# Patient Record
Sex: Female | Born: 1965 | Race: White | Hispanic: No | Marital: Married | Smoking: Never smoker
Health system: Southern US, Community
[De-identification: ages and names within clinical notes are randomized; demographics above are authoritative.]

## PROBLEM LIST (undated history)

## (undated) DIAGNOSIS — M1990 Unspecified osteoarthritis, unspecified site: Secondary | ICD-10-CM

## (undated) HISTORY — PX: SHOULDER SURGERY: SHX246

## (undated) HISTORY — PX: CYSTECTOMY: SUR359

---

## 2014-04-24 ENCOUNTER — Other Ambulatory Visit: Payer: Self-pay | Admitting: Physician Assistant

## 2014-04-24 ENCOUNTER — Encounter: Payer: Self-pay | Admitting: Physician Assistant

## 2014-04-24 ENCOUNTER — Ambulatory Visit (INDEPENDENT_AMBULATORY_CARE_PROVIDER_SITE_OTHER): Payer: BC Managed Care – PPO | Admitting: Physician Assistant

## 2014-04-24 VITALS — BP 114/74 | HR 89 | Ht 70.0 in | Wt 232.0 lb

## 2014-04-24 DIAGNOSIS — E669 Obesity, unspecified: Secondary | ICD-10-CM | POA: Diagnosis not present

## 2014-04-24 DIAGNOSIS — E66811 Obesity, class 1: Secondary | ICD-10-CM | POA: Insufficient documentation

## 2014-04-24 DIAGNOSIS — N898 Other specified noninflammatory disorders of vagina: Secondary | ICD-10-CM

## 2014-04-24 DIAGNOSIS — E6609 Other obesity due to excess calories: Secondary | ICD-10-CM | POA: Insufficient documentation

## 2014-04-24 DIAGNOSIS — Z6831 Body mass index (BMI) 31.0-31.9, adult: Secondary | ICD-10-CM

## 2014-04-24 DIAGNOSIS — Z1231 Encounter for screening mammogram for malignant neoplasm of breast: Secondary | ICD-10-CM

## 2014-04-24 DIAGNOSIS — R635 Abnormal weight gain: Secondary | ICD-10-CM | POA: Diagnosis not present

## 2014-04-24 LAB — WET PREP FOR TRICH, YEAST, CLUE
Clue Cells Wet Prep HPF POC: NONE SEEN
Trich, Wet Prep: NONE SEEN
Yeast Wet Prep HPF POC: NONE SEEN

## 2014-04-24 MED ORDER — PHENTERMINE HCL 37.5 MG PO TABS: 37.5000 mg | ORAL_TABLET | Freq: Every day | ORAL | Status: DC

## 2014-04-24 NOTE — Progress Notes (Signed)
   Subjective:    Patient ID: Renee Hopkins, female    DOB: 01/22/1966, 49 y.o.   MRN: [id number]  HPI  Patient is a 49 year old female who presents to the clinic to establish care.  Patient has no pertinent past medical history.  .. Family History  Problem Relation Age of Onset  . Diabetes Father   . Hypertension Father   . Hypertension Brother   . Stroke Maternal Grandmother    .[name]. History   Social History  . Marital Status: Married    Spouse Name: N/A  . Number of Children: N/A  . Years of Education: N/A   Occupational History  . Not on file.   Social History Main Topics  . Smoking status: Never Smoker   . Smokeless tobacco: Not on file  . Alcohol Use: No  . Drug Use: No  . Sexual Activity:    Partners: Male   Other Topics Concern  . Not on file   Social History Narrative  . No narrative on file   Patient would like to be evaluated for some vaginal discharge. She has always had heavy her discharged and normal. She just needs to make sure that it has not changed. She denies any odor. Sometimes she has a mild itchiness but not all the time. She has one sexual partner her husband.  She is very motivated to lose weight at this time. She would like help in this arena. She has not tried any medications before. She is not currently exercising.   Review of Systems  All other systems reviewed and are negative.      Objective:   Physical Exam  Constitutional: She is oriented to person, place, and time. She appears well-developed and well-nourished.  Obese  HENT:  Head: Normocephalic and atraumatic.  Cardiovascular: Normal rate, regular rhythm and normal heart sounds.   Pulmonary/Chest: Effort normal and breath sounds normal.  Neurological: She is alert and oriented to person, place, and time.  Skin: Skin is dry.  Psychiatric: She has a normal mood and affect. Her behavior is normal.          Assessment & Plan:  Obesity/abnormal weight gain-discuss  options for medication weight loss. Patient decided on phentermine. Side effects were discussed. Patient will follow-up in one month for final and to discuss weight loss. We discussed in detail exercise plan and diet plan. Encouraged patient to keep to a 1200-1500-calorie diet. Also encouraged patient to exercise at least 150 minutes a week.  Vaginal discharge-will get wet prep today. Will treat accordingly. Certainly if she has a history of vaginal discharge this could be normal leukorrhea. If she is still concerned we could STD test her. She declined any STD testing today.

## 2014-04-24 NOTE — Patient Instructions (Signed)
[employer]  [employer]

## 2014-04-27 ENCOUNTER — Ambulatory Visit (INDEPENDENT_AMBULATORY_CARE_PROVIDER_SITE_OTHER): Payer: BC Managed Care – PPO

## 2014-04-27 DIAGNOSIS — Z1231 Encounter for screening mammogram for malignant neoplasm of breast: Secondary | ICD-10-CM

## 2014-05-09 ENCOUNTER — Encounter: Payer: Self-pay | Admitting: Physician Assistant

## 2014-05-09 ENCOUNTER — Ambulatory Visit (INDEPENDENT_AMBULATORY_CARE_PROVIDER_SITE_OTHER): Payer: BC Managed Care – PPO | Admitting: Physician Assistant

## 2014-05-09 VITALS — BP 129/81 | HR 106 | Wt 226.0 lb

## 2014-05-09 DIAGNOSIS — R21 Rash and other nonspecific skin eruption: Secondary | ICD-10-CM

## 2014-05-09 MED ORDER — CEPHALEXIN 500 MG PO CAPS: 500.0000 mg | ORAL_CAPSULE | Freq: Two times a day (BID) | ORAL | Status: DC

## 2014-05-09 MED ORDER — VALACYCLOVIR HCL 1 G PO TABS: 1000.0000 mg | ORAL_TABLET | Freq: Two times a day (BID) | ORAL | Status: DC

## 2014-05-09 NOTE — Progress Notes (Signed)
   Subjective:    Patient ID: Renee Hopkins, female    DOB: 02/11/1966, 49 y.o.   MRN: [id number]  HPI  Patient is a 49 year old female who presents to the clinic with a rash on her right buttocks. She first noticed the rash last week. She tried to put some Lotrimin cream and did seem to help dry up. More timely blisters have appeared since. She describes the rash is mostly itchy but does burn some. No fever, chills. She has just started using the tanning bed. She denies any new detergents or lotions.   Review of Systems  All other systems reviewed and are negative.      Objective:   Physical Exam  Skin:             Assessment & Plan:  Rash- appears to me like herpes with some surround cellulitis. Will send viral and wound culture off today. Treated with keflex and valtrex for 10 days. Cool compresses. Call with any new lesions or symptoms. Follow up as needed.

## 2014-05-12 LAB — WOUND CULTURE
GRAM STAIN: NONE SEEN
GRAM STAIN: NONE SEEN
Gram Stain: NONE SEEN
ORGANISM ID, BACTERIA: NO GROWTH

## 2014-05-13 LAB — RFLXH. SIMPLEX/VZ VIRUS CULT/DIF

## 2014-05-14 ENCOUNTER — Encounter: Payer: Self-pay | Admitting: Physician Assistant

## 2014-05-14 DIAGNOSIS — B009 Herpesviral infection, unspecified: Secondary | ICD-10-CM | POA: Insufficient documentation

## 2014-05-16 LAB — VIRAL CULTURE VIRC: SOURCE: 0

## 2014-05-22 ENCOUNTER — Encounter: Payer: Self-pay | Admitting: Physician Assistant

## 2014-05-22 ENCOUNTER — Ambulatory Visit (INDEPENDENT_AMBULATORY_CARE_PROVIDER_SITE_OTHER): Payer: BC Managed Care – PPO | Admitting: Physician Assistant

## 2014-05-22 VITALS — BP 133/84 | HR 96 | Ht 70.0 in | Wt 223.0 lb

## 2014-05-22 DIAGNOSIS — R635 Abnormal weight gain: Secondary | ICD-10-CM | POA: Diagnosis not present

## 2014-05-22 DIAGNOSIS — E669 Obesity, unspecified: Secondary | ICD-10-CM

## 2014-05-22 MED ORDER — PHENTERMINE HCL 37.5 MG PO TABS: 37.5000 mg | ORAL_TABLET | Freq: Every day | ORAL | Status: DC

## 2014-05-22 NOTE — Progress Notes (Signed)
   Subjective:    Patient ID: Renee Hopkins, female    DOB: 07/13/1965, 49 y.o.   MRN: [id number]  HPI Pt presents to the clinic for 1 month follow up on weight and phentermine. Taking phentermine daily. Down 9lbs in one month. She has no ongoing symptoms. She did have on episode where she forgot to eat and/or drink until about 3pm one day and passed out. She denies any heart palpitations, insomnia, anxiety, constipation. She is happy with results. She is walking a few times a week. She has decreased her calories substantially.     Review of Systems  All other systems reviewed and are negative.      Objective:   Physical Exam  Constitutional: She is oriented to person, place, and time. She appears well-developed and well-nourished.  HENT:  Head: Normocephalic and atraumatic.  Cardiovascular: Normal rate, regular rhythm and normal heart sounds.   Pulmonary/Chest: Effort normal and breath sounds normal.  Neurological: She is alert and oriented to person, place, and time.  Psychiatric: She has a normal mood and affect. Her behavior is normal.          Assessment & Plan:  Obesity/abnormal weight gain- refilled phentermine for one month. Follow up in 1 month with nurse visit. As long losing weight and no side effects will keep on for 6-9 months. I did discuss importance of eating at least 1200 calories a day and drinking plenty of water 40-60oz a day. Continue to increase exercise.

## 2014-05-26 ENCOUNTER — Telehealth: Payer: Self-pay | Admitting: Physician Assistant

## 2014-05-26 NOTE — Telephone Encounter (Signed)
i would do warm vinegar bath soaks 1-2 cups for 15 minutes at a time to restore normal PH. If having any discharge considerOTC montistat for yeast.

## 2014-05-26 NOTE — Telephone Encounter (Signed)
Informed Pt of recommendations. Verbalized understanding. No further questions.

## 2014-05-26 NOTE — Telephone Encounter (Signed)
Patient contacted clinic stating she has been having some vaginal itching and wants to know what you would recommend OTC to help her until she comes in for an appt on 05/29/14.

## 2014-05-29 ENCOUNTER — Other Ambulatory Visit: Payer: Self-pay | Admitting: *Deleted

## 2014-05-29 ENCOUNTER — Encounter: Payer: Self-pay | Admitting: Physician Assistant

## 2014-05-29 ENCOUNTER — Other Ambulatory Visit: Payer: Self-pay | Admitting: Physician Assistant

## 2014-05-29 ENCOUNTER — Ambulatory Visit (INDEPENDENT_AMBULATORY_CARE_PROVIDER_SITE_OTHER): Payer: BC Managed Care – PPO | Admitting: Physician Assistant

## 2014-05-29 VITALS — BP 128/83 | HR 89 | Wt 221.0 lb

## 2014-05-29 DIAGNOSIS — L304 Erythema intertrigo: Secondary | ICD-10-CM

## 2014-05-29 DIAGNOSIS — N898 Other specified noninflammatory disorders of vagina: Secondary | ICD-10-CM

## 2014-05-29 DIAGNOSIS — L298 Other pruritus: Secondary | ICD-10-CM | POA: Diagnosis not present

## 2014-05-29 DIAGNOSIS — N926 Irregular menstruation, unspecified: Secondary | ICD-10-CM | POA: Diagnosis not present

## 2014-05-29 LAB — WET PREP FOR TRICH, YEAST, CLUE
TRICH WET PREP: NONE SEEN
YEAST WET PREP: NONE SEEN

## 2014-05-29 MED ORDER — FLUCONAZOLE 150 MG PO TABS: 150.0000 mg | ORAL_TABLET | Freq: Once | ORAL | Status: DC

## 2014-05-29 MED ORDER — METRONIDAZOLE 500 MG PO TABS: 500.0000 mg | ORAL_TABLET | Freq: Two times a day (BID) | ORAL | Status: DC

## 2014-05-29 MED ORDER — NYSTATIN-TRIAMCINOLONE 100000-0.1 UNIT/GM-% EX OINT: 1.0000 "application " | TOPICAL_OINTMENT | Freq: Two times a day (BID) | CUTANEOUS | Status: DC

## 2014-05-29 NOTE — Progress Notes (Signed)
   Subjective:    Patient ID: Renee Hopkins, female    DOB: 05/04/1965, 49 y.o.   MRN: [id number]  HPI  Pt presents to the clinic with vaginal itching mostly on outside but some internally as well. No abnormal discharge. She has had some spotting as well in between her regular period. She has just started phentermine. She tans regularly. No fever, chills, n/v/d. No urinary symptoms.    Review of Systems  All other systems reviewed and are negative.      Objective:   Physical Exam  Constitutional: She is oriented to person, place, and time. She appears well-developed and well-nourished.  HENT:  Head: Normocephalic and atraumatic.  Cardiovascular: Normal rate, regular rhythm and normal heart sounds.   Pulmonary/Chest: Effort normal and breath sounds normal.  Genitourinary:     Neurological: She is alert and oriented to person, place, and time.  Skin: Skin is dry.  Psychiatric: She has a normal mood and affect. Her behavior is normal.          Assessment & Plan:  Intertrigo- diflucan given with nystatin cream. Likely tanning bed causing yeast. HO given. Suggested to use gold bond. She does admit to sweating a lot.   Vaginal itching- wet prep done. Will call with results.   Spotting- will check FSH/LH. Could be start of phentermine and increase in exercise. If continues can discuss options at future appt. Certainly could be some hormonal changes with possible menopausal changes occuring.

## 2014-05-29 NOTE — Patient Instructions (Signed)
Intertrigo Intertrigo is a skin condition that occurs in between folds of skin in places on the body that rub together a lot and do not get much ventilation. It is caused by heat, moisture, friction, sweat retention, and lack of air circulation, which produces red, irritated patches and, sometimes, scaling or drainage. People who have diabetes, who are obese, or who have treatment with antibiotics are at increased risk for intertrigo. The most common sites for intertrigo to occur include:  The groin.  The breasts.  The armpits.  Folds of abdominal skin.  Webbed spaces between the fingers or toes. Intertrigo may be aggravated by:  Sweat.  Feces.  Yeast or bacteria that are present near skin folds.  Urine.  Vaginal discharge. HOME CARE INSTRUCTIONS  The following steps can be taken to reduce friction and keep the affected area cool and dry:  Expose skin folds to the air.  Keep deep skin folds separated with cotton or linen cloth. Avoid tight fitting clothing that could cause chafing.  Wear open-toed shoes or sandals to help reduce moisture between the toes.  Apply absorbent powders to affected areas as directed by your caregiver.  Apply over-the-counter barrier pastes, such as zinc oxide, as directed by your caregiver.  If you develop a fungal infection in the affected area, your caregiver may have you use antifungal creams. SEEK MEDICAL CARE IF:   The rash is not improving after 1 week of treatment.  The rash is getting worse (more red, more swollen, more painful, or spreading).  You have a fever or chills. MAKE SURE YOU:   Understand these instructions.  Will watch your condition.  Will get help right away if you are not doing well or get worse. Document Released: 02/10/2005 Document Revised: 05/05/2011 Document Reviewed: 07/26/2009 Exit[employer] Patient Information 2015 [employer]. This information is not intended to replace advice given to you by your health  care provider. Make sure you discuss any questions you have with your health care provider.  

## 2014-06-01 DIAGNOSIS — L304 Erythema intertrigo: Secondary | ICD-10-CM | POA: Insufficient documentation

## 2014-06-20 ENCOUNTER — Encounter: Payer: Self-pay | Admitting: Physician Assistant

## 2014-06-20 ENCOUNTER — Ambulatory Visit (INDEPENDENT_AMBULATORY_CARE_PROVIDER_SITE_OTHER): Payer: BC Managed Care – PPO | Admitting: Physician Assistant

## 2014-06-20 ENCOUNTER — Other Ambulatory Visit: Payer: Self-pay | Admitting: *Deleted

## 2014-06-20 VITALS — BP 124/89 | HR 90 | Wt 211.0 lb

## 2014-06-20 DIAGNOSIS — E669 Obesity, unspecified: Secondary | ICD-10-CM

## 2014-06-20 DIAGNOSIS — R635 Abnormal weight gain: Secondary | ICD-10-CM

## 2014-06-20 MED ORDER — PHENTERMINE HCL 37.5 MG PO TABS: 37.5000 mg | ORAL_TABLET | Freq: Every day | ORAL | Status: DC

## 2014-06-20 NOTE — Progress Notes (Signed)
Patient ID: Renee Hopkins, female   DOB: 07/30/1965, 49 y.o.   MRN: [id number]     Patient came in today for a blood pressure & weight check.  Patient is doing well with weight loss program & blood pressure is good also.  One concern patient did have is that this month she is having a very heavy menstrual cycle.  Patient has heard about a vitamin called phen-vite  which helps with menstrual cycles. And  wonders what you think about it?  Abnormal weight gain/obesity- 10 pound weight loss. Doing great. Vitals stable. Phen-vites continue more appetite suppressing and energy formulations. I do not suggest to use with phentermine. Certainly once we stop would be ok to use. Phentermine refilled for 1 month. Follow up nurse visit in one month

## 2014-07-20 ENCOUNTER — Ambulatory Visit (INDEPENDENT_AMBULATORY_CARE_PROVIDER_SITE_OTHER): Payer: BC Managed Care – PPO | Admitting: Family Medicine

## 2014-07-20 VITALS — BP 120/79 | HR 91 | Wt 205.0 lb

## 2014-07-20 DIAGNOSIS — Z6829 Body mass index (BMI) 29.0-29.9, adult: Secondary | ICD-10-CM | POA: Diagnosis not present

## 2014-07-20 DIAGNOSIS — R635 Abnormal weight gain: Secondary | ICD-10-CM

## 2014-07-20 MED ORDER — PHENTERMINE HCL 37.5 MG PO TABS: 37.5000 mg | ORAL_TABLET | Freq: Every day | ORAL | Status: DC

## 2014-07-20 NOTE — Progress Notes (Signed)
   Subjective:    Patient ID: Renee PicklesMelanie Hopkins, female    DOB: 10/13/1965, 49 y.o.   MRN: [id number]  HPI Abnormal weight gain-down 6 pounds. Phentermine refill. Blood pressure at goal. Follow-up 4 weeks. Renee Gasseratherine Metheney, MD    Review of Systems     Objective:   Physical Exam        Assessment & Plan:

## 2014-07-20 NOTE — Progress Notes (Signed)
   Subjective:    Patient ID: Renee Hopkins, female    DOB: 07/19/1965, 49 y.o.   MRN: [id number]  HPI  Patient is here for blood pressure and weight check. Denies any trouble sleeping, palpitations, or any other medication problems. Patient states she has "ate more than she should" over the past month and has not been "walking like she should."   Review of Systems     Objective:   Physical Exam        Assessment & Plan:  Patient has lost weight. A refill for Phentermine will be sent to patient preferred pharmacy. Patient advised to schedule a four week nurse visit and keep her upcoming appointment with her PCP. Verbalized understanding, no further questions.

## 2014-08-15 ENCOUNTER — Ambulatory Visit (INDEPENDENT_AMBULATORY_CARE_PROVIDER_SITE_OTHER): Payer: BC Managed Care – PPO | Admitting: Family Medicine

## 2014-08-15 VITALS — BP 114/78 | HR 91 | Ht 70.0 in | Wt 198.0 lb

## 2014-08-15 DIAGNOSIS — R635 Abnormal weight gain: Secondary | ICD-10-CM | POA: Diagnosis not present

## 2014-08-15 DIAGNOSIS — E669 Obesity, unspecified: Secondary | ICD-10-CM

## 2014-08-15 MED ORDER — PHENTERMINE HCL 37.5 MG PO TABS: 37.5000 mg | ORAL_TABLET | Freq: Every day | ORAL | Status: DC

## 2014-08-15 NOTE — Progress Notes (Signed)
Weight loss success, another month of phentermine provided

## 2014-08-15 NOTE — Progress Notes (Signed)
   Subjective:    Patient ID: Renee Hopkins, female    DOB: 1965-04-18, 49 y.o.   MRN: [id number] Pt in today for weight & BP check.  She is down 7lbs from last month.  Denies any side effects. Donne Anon, CMA HPI    Review of Systems     Objective:   Physical Exam        Assessment & Plan:

## 2014-09-18 ENCOUNTER — Ambulatory Visit (INDEPENDENT_AMBULATORY_CARE_PROVIDER_SITE_OTHER): Payer: BC Managed Care – PPO | Admitting: Family Medicine

## 2014-09-18 VITALS — BP 123/80 | HR 94 | Wt 192.0 lb

## 2014-09-18 DIAGNOSIS — Z6827 Body mass index (BMI) 27.0-27.9, adult: Secondary | ICD-10-CM | POA: Diagnosis not present

## 2014-09-18 DIAGNOSIS — R635 Abnormal weight gain: Secondary | ICD-10-CM | POA: Diagnosis not present

## 2014-09-18 MED ORDER — PHENTERMINE HCL 37.5 MG PO TABS: 37.5000 mg | ORAL_TABLET | Freq: Every day | ORAL | Status: DC

## 2014-09-18 NOTE — Progress Notes (Signed)
   Subjective:    Patient ID: Renee Hopkins, female    DOB: October 14, 1965, 49 y.o.   MRN: [id number]  HPI    Review of Systems     Objective:   Physical Exam        Assessment & Plan:  Abnormal weight gain-she has lost 6 pounds which is fantastic. Blood pressures well controlled. Continue current regimen follow-up in one month.  Nani Gasser, MD

## 2014-09-18 NOTE — Progress Notes (Signed)
   Subjective:    Patient ID: Renee Hopkins, female    DOB: Mar 13, 1965, 49 y.o.   MRN: [id number]  HPI Patient is here for blood pressure and weight check. Denies any trouble sleeping, palpitations, or any other medication problems. Pt reports this month she has fluctuated from around 190-195, states she is still walking and watching her diet.    Review of Systems     Objective:   Physical Exam        Assessment & Plan:  Patient has lost weight. A refill for Phentermine will be sent to patient preferred pharmacy. Patient advised to schedule a four week nurse visit and keep her upcoming appointment with her PCP. Verbalized understanding, no further questions.

## 2014-10-23 ENCOUNTER — Encounter: Payer: Self-pay | Admitting: Physician Assistant

## 2014-10-23 ENCOUNTER — Ambulatory Visit (INDEPENDENT_AMBULATORY_CARE_PROVIDER_SITE_OTHER): Payer: BC Managed Care – PPO | Admitting: Physician Assistant

## 2014-10-23 VITALS — BP 108/74 | HR 102 | Ht 70.0 in | Wt 185.0 lb

## 2014-10-23 DIAGNOSIS — E663 Overweight: Secondary | ICD-10-CM | POA: Diagnosis not present

## 2014-10-23 DIAGNOSIS — R635 Abnormal weight gain: Secondary | ICD-10-CM | POA: Diagnosis not present

## 2014-10-23 MED ORDER — PHENTERMINE HCL 37.5 MG PO TABS: 37.5000 mg | ORAL_TABLET | Freq: Every day | ORAL | Status: DC

## 2014-10-23 NOTE — Progress Notes (Signed)
   Subjective:    Patient ID: Renee Hopkins, female    DOB: Jun 17, 1965, 49 y.o.   MRN: [id number]  HPI  Pt presents to the clinic to for 6 month follow up on phentermine. Doing great. 185 today with BMI of 26.54. Down 7lbs in last month. Total weight loss of 47lbs. Walking every night for exercise. Considering joining cross fit. cosuming 1200-1500 calories a day. No side effects. No insomnia, dry mouth, CP, tachycardia.     Review of Systems  All other systems reviewed and are negative.      Objective:   Physical Exam  Constitutional: She is oriented to person, place, and time. She appears well-developed and well-nourished.  HENT:  Head: Normocephalic and atraumatic.  Cardiovascular: Regular rhythm and normal heart sounds.   Tachycardia at 102.   Pulmonary/Chest: Effort normal and breath sounds normal. She has no wheezes.  Neurological: She is alert and oriented to person, place, and time.  Psychiatric: She has a normal mood and affect. Her behavior is normal.          Assessment & Plan:  Abnormal weight gain/overweight- doing great. Will transition down to phentermine 1/2 tablet daily. Follow up in 2 months. Continue diet and exercise. Cross fit I think would be a good addition.   Needs CPE will schedule in 2 months call to get labs before visit. Pt needs fasting labs.   Pt declined flu shot.

## 2014-11-07 ENCOUNTER — Encounter: Payer: Self-pay | Admitting: Physician Assistant

## 2014-11-08 ENCOUNTER — Other Ambulatory Visit: Payer: Self-pay | Admitting: Physician Assistant

## 2014-11-08 MED ORDER — VALACYCLOVIR HCL 500 MG PO TABS: 500.0000 mg | ORAL_TABLET | Freq: Two times a day (BID) | ORAL | Status: DC

## 2015-01-24 ENCOUNTER — Other Ambulatory Visit: Payer: Self-pay | Admitting: Physician Assistant

## 2015-01-24 ENCOUNTER — Encounter: Payer: Self-pay | Admitting: Physician Assistant

## 2015-01-24 MED ORDER — VALACYCLOVIR HCL 500 MG PO TABS: 500.0000 mg | ORAL_TABLET | Freq: Two times a day (BID) | ORAL | Status: DC

## 2015-01-24 NOTE — Addendum Note (Signed)
Addended by: Pixie CasinoUNNINGHAM, Cyndee Giammarco C on: 01/24/2015 09:34 AM   Modules accepted: Orders

## 2015-04-13 ENCOUNTER — Ambulatory Visit (INDEPENDENT_AMBULATORY_CARE_PROVIDER_SITE_OTHER): Payer: BC Managed Care – PPO

## 2015-04-13 ENCOUNTER — Encounter: Payer: Self-pay | Admitting: Rehabilitative and Restorative Service Providers"

## 2015-04-13 ENCOUNTER — Ambulatory Visit (INDEPENDENT_AMBULATORY_CARE_PROVIDER_SITE_OTHER): Payer: BC Managed Care – PPO | Admitting: Physician Assistant

## 2015-04-13 ENCOUNTER — Ambulatory Visit (INDEPENDENT_AMBULATORY_CARE_PROVIDER_SITE_OTHER): Payer: BC Managed Care – PPO | Admitting: Rehabilitative and Restorative Service Providers"

## 2015-04-13 ENCOUNTER — Encounter: Payer: Self-pay | Admitting: Physician Assistant

## 2015-04-13 VITALS — BP 137/72 | HR 95 | Ht 70.0 in | Wt 196.0 lb

## 2015-04-13 DIAGNOSIS — M623 Immobility syndrome (paraplegic): Secondary | ICD-10-CM | POA: Diagnosis not present

## 2015-04-13 DIAGNOSIS — M62830 Muscle spasm of back: Secondary | ICD-10-CM | POA: Diagnosis not present

## 2015-04-13 DIAGNOSIS — R293 Abnormal posture: Secondary | ICD-10-CM | POA: Diagnosis not present

## 2015-04-13 DIAGNOSIS — Z7409 Other reduced mobility: Secondary | ICD-10-CM

## 2015-04-13 DIAGNOSIS — M2560 Stiffness of unspecified joint, not elsewhere classified: Secondary | ICD-10-CM

## 2015-04-13 DIAGNOSIS — M542 Cervicalgia: Secondary | ICD-10-CM

## 2015-04-13 DIAGNOSIS — S29012A Strain of muscle and tendon of back wall of thorax, initial encounter: Secondary | ICD-10-CM | POA: Diagnosis not present

## 2015-04-13 DIAGNOSIS — M25512 Pain in left shoulder: Secondary | ICD-10-CM | POA: Diagnosis not present

## 2015-04-13 MED ORDER — CYCLOBENZAPRINE HCL 10 MG PO TABS: 10.0000 mg | ORAL_TABLET | Freq: Three times a day (TID) | ORAL | Status: DC | PRN

## 2015-04-13 MED ORDER — KETOROLAC TROMETHAMINE 60 MG/2ML IM SOLN
60.0000 mg | Freq: Once | INTRAMUSCULAR | Status: AC
  Administered 2015-04-13: 60 mg via INTRAMUSCULAR

## 2015-04-13 MED ORDER — MELOXICAM 15 MG PO TABS: 15.0000 mg | ORAL_TABLET | Freq: Every day | ORAL | Status: DC

## 2015-04-13 MED ORDER — TRAMADOL HCL 50 MG PO TABS: 50.0000 mg | ORAL_TABLET | Freq: Three times a day (TID) | ORAL | Status: DC | PRN
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
## 2015-04-13 NOTE — Patient Instructions (Signed)
Self massage using ~4 inch rubber ball   Axial Extension (Chin Tuck)    Pull chin in and lengthen back of neck. Hold _10-15___ seconds while counting out loud. Repeat _5-10___ times. Do _several___ sessions per day.   Shoulder Blade Squeeze     Can use swim noodle to help with posture and as a tactile cue  Rotate shoulders back, then squeeze shoulder blades down and back Hold 10 sec  Repeat __10__ times. Do __several__ sessions per day.   ROM: Pendulum (Circular)    Let right arm move in circle clockwise, then counterclockwise, by rocking body weight in circular pattern. Circle __30__ times each direction per set. Do __1-2__ sets per session. Do __3-4__ sessions per day.  Abduction (Eccentric) - Active-Assist (Pulley)   First position is with hands facing each other about shoulder width apart; second position is with arms out to side slightly.  With hands lightly holding pulley, raise affected arm out to side with other arm. Avoid hiking shoulder. Then slowly lower affected arm for 10 seconds. __10_ reps per set for each position, _3-4__ sets per day   Anterior Capsule Stretch, Standing    Stand holding stick behind back, hands palms up. Lift stick away from body as far as possible. Hold __10_ seconds. Repeat _5-10__ times per session. Do _3-4__ sessions per day.     External Rotator Cuff Stretch, Standing    Stand holding club behind body, one arm above head, other arm bent behind back. With lower hand, pull gently downward. Hold _10__ seconds. Repeat _10__ times per session. Do _3-4__ sessions per day.    External Rotator Cuff Stretch, Sitting (Passive)    Sit with elbow bent, forearm on table, palm down. Bend forward at waist until a stretch is felt in shoulder. Hold _10__ seconds.  Repeat __10_ times per session. Do _3-4__ sessions per day. Can do this stretch in standing  Check info for adhesive capsulitis or frozen shoulder    TENS UNIT: This is  helpful for muscle pain and spasm.   Search and Purchase a TENS 7000 2nd edition at www.tenspros.com. It should be less than $30.     TENS unit instructions: Do not shower or bathe with the unit on Turn the unit off before removing electrodes or batteries If the electrodes lose stickiness add a drop of water to the electrodes after they are disconnected from the unit and place on plastic sheet. If you continued to have difficulty, call the TENS unit company to purchase more electrodes. Do not apply lotion on the skin area prior to use. Make sure the skin is clean and dry as this will help prolong the life of the electrodes. After use, always check skin for unusual red areas, rash or other skin difficulties. If there are any skin problems, does not apply electrodes to the same area. Never remove the electrodes from the unit by pulling the wires. Do not use the TENS unit or electrodes other than as directed. Do not change electrode placement without consultating your therapist or physician. Keep 2 fingers with between each electrode.
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
## 2015-04-13 NOTE — Patient Instructions (Signed)
Will place PT order for you start.

## 2015-04-16 ENCOUNTER — Encounter: Payer: BC Managed Care – PPO | Admitting: Physical Therapy

## 2015-04-16 ENCOUNTER — Encounter: Payer: Self-pay | Admitting: Physician Assistant

## 2015-04-16 DIAGNOSIS — S29012A Strain of muscle and tendon of back wall of thorax, initial encounter: Secondary | ICD-10-CM | POA: Insufficient documentation

## 2015-04-16 DIAGNOSIS — M5412 Radiculopathy, cervical region: Secondary | ICD-10-CM | POA: Insufficient documentation

## 2015-04-16 DIAGNOSIS — M62830 Muscle spasm of back: Secondary | ICD-10-CM | POA: Insufficient documentation

## 2015-04-19 ENCOUNTER — Encounter: Payer: Self-pay | Admitting: Rehabilitative and Restorative Service Providers"

## 2015-04-19 ENCOUNTER — Ambulatory Visit (INDEPENDENT_AMBULATORY_CARE_PROVIDER_SITE_OTHER): Payer: BC Managed Care – PPO | Admitting: Rehabilitative and Restorative Service Providers"

## 2015-04-19 DIAGNOSIS — Z7409 Other reduced mobility: Secondary | ICD-10-CM | POA: Diagnosis not present

## 2015-04-19 DIAGNOSIS — M25512 Pain in left shoulder: Secondary | ICD-10-CM | POA: Diagnosis not present

## 2015-04-19 DIAGNOSIS — M623 Immobility syndrome (paraplegic): Secondary | ICD-10-CM | POA: Diagnosis not present

## 2015-04-19 DIAGNOSIS — R293 Abnormal posture: Secondary | ICD-10-CM

## 2015-04-19 DIAGNOSIS — M2560 Stiffness of unspecified joint, not elsewhere classified: Secondary | ICD-10-CM

## 2015-04-19 NOTE — Therapy (Signed)
[facility] [place] [address] [place], [place], [address] Phone: [phone]   Fax:  [phone]  Physical Therapy Treatment  Patient Details  Name: Renee Hopkins MRN: [id number] Date of Birth: 09-23-1965 Referring Provider: Tandy Gaw PCA  Encounter Date: 04/19/2015      PT End of Session - 04/19/15 1552    Visit Number 2   Number of Visits 12   Date for PT Re-Evaluation 05/25/15   PT Start Time 1544   PT Stop Time 1637   PT Time Calculation (min) 53 min   Activity Tolerance Patient tolerated treatment well;Patient limited by pain      History reviewed. No pertinent past medical history.  Past Surgical History  Procedure Laterality Date  . Shoulder surgery      There were no vitals filed for this visit.  Visit Diagnosis:  Shoulder pain, left  Stiffness due to immobility  Abnormal posture  Decreased functional mobility and endurance      Subjective Assessment - 04/19/15 1543    Subjective Patient reports that her shoulder pain is about the same overall. She can't take the tramadol at work but takes it at night. She is taking mm relaxant 1x/day. She has ordered a TENS unit and is using it at home and cont to use the Biofreeze.    Currently in Pain? Yes   Pain Score 5    Pain Location Shoulder   Pain Orientation Left   Pain Descriptors / Indicators Aching;Tightness   Pain Type Chronic pain   Pain Onset More than a month ago   Pain Frequency Constant            [facility] PT Assessment - 04/19/15 0001    Assessment   Medical Diagnosis Lt shoulder dysfunction   Referring Provider Tandy Gaw PCA   Onset Date/Surgical Date 10/09/15   Hand Dominance Right   Next MD Visit no appt scheduled   Prior Therapy none for current complaints   AROM   Left Shoulder Extension 33 Degrees   Left Shoulder Flexion 131 Degrees   Left Shoulder ABduction 120 Degrees                     [facility] Adult PT  Treatment/Exercise - 04/19/15 0001    Shoulder Exercises: Supine   Other Supine Exercises axial extension 10 sec x 5    Shoulder Exercises: Standing   Other Standing Exercises scap squeeze 10 sec x 10 reps with noodle    Shoulder Exercises: Pulleys   Flexion --  10 sec x 10    Other Pulley Exercises scaption 10 sec hold x 10   Shoulder Exercises: ROM/Strengthening   UBE (Upper Arm Bike) L1 x 3 min alt fwd/back   Pendulum 30 CW/30 CCW with 2 lb wt   Shoulder Exercises: Stretch   Internal Rotation Stretch 5 reps  10 sec hold with cane and with strap   Table Stretch - Flexion 5 reps;10 seconds   Other Shoulder Stretches shd extension with cane 5 sec x 5 reps; bringing cane across body in extension for horizontal adduction behind back 2 reps 5 sec hold    Other Shoulder Stretches doorway stretch x 3 positions 30 sec hold x 2    Moist Heat Therapy   Number Minutes Moist Heat 15 Minutes   Moist Heat Location Shoulder  Lt shd and upper back    Electrical Stimulation   Electrical Stimulation Location Lt shoulder  Electrical Stimulation Action IFC   Electrical Stimulation Parameters to tolerance   Electrical Stimulation Goals Pain;Tone   Manual Therapy   Manual therapy comments pt supine    Joint Mobilization scap Lt   Soft tissue mobilization ant/lat/post cervical; upper trap; leveator; periscapular musculature; pecs   Myofascial Release pesc/chest   Passive ROM cervical flexion and flexion eith slight rotatioin 1 x each 20-30 sec hold                 PT Education - 04/19/15 1604    Education provided Yes   Education Details HPEP   Person(s) Educated Patient   Methods Explanation;Demonstration;Tactile cues;Verbal cues;Handout   Comprehension Verbalized understanding;Returned demonstration;Verbal cues required;Tactile cues required             PT Long Term Goals - 04/19/15 1734    PT LONG TERM GOAL #1   Title Decrease pain allowing pt to sleep and work with min to  no pain 05/25/15   Time 6   Period Weeks   Status On-going   PT LONG TERM GOAL #2   Title Increase Lt shd ROM to equal or greater than Rt shd ROM 05/25/15   Time 6   Period Weeks   Status On-going   PT LONG TERM GOAL #3   Title Patient reports return to normal functional activities including laundry, washing dishes; sitting/standing/walking > 30 min 05/25/15   Time 6   Period Weeks   Status On-going   PT LONG TERM GOAL #4   Title I in HEP 05/25/15   Time 6   Period Weeks   Status On-going   PT LONG TERM GOAL #5   Title Improve FOTO to </= 34% limitation 05/25/15   Time 6   Period Weeks   Status On-going               Plan - 04/19/15 1731    Clinical Impression Statement Continued pain in the Lt shoudler and neck area as well as muscular tightness and banding. Pt has propped phone with Lt shoulder for 16 years. She has tightness through the cervical area influencing the shoulder pain. Patient may benefit form TDN which was discussed with her today and she is willing to try the needling.  Good improvement in shd/pec tightness with manual work and passive stretch through the cervical spine today.    Pt will benefit from skilled therapeutic intervention in order to improve on the following deficits Postural dysfunction;Improper body mechanics;Decreased range of motion;Decreased mobility;Increased fascial restricitons;Pain;Decreased endurance;Decreased activity tolerance   Rehab Potential Good   PT Frequency 2x / week   PT Duration 8 weeks   PT Treatment/Interventions Patient/family education;ADLs/Self Care Home Management;Therapeutic exercise;Therapeutic activities;Manual techniques;Dry needling;Neuromuscular re-education;Cryotherapy;Electrical Stimulation;Iontophoresis 4mg /ml Dexamethasone;Moist Heat;Ultrasound   PT Next Visit Plan progress with postural correction and posterior shd girdle strengthening as tolerated; work on Lt shd ROM; manual therapy and joint mobs through shoudler  and cervical spine; TDN as indicated   PT Home Exercise Plan postural correction; Myofacial ball release work: HEP    Financial planner with Plan of Care Patient        Problem List Patient Active Problem List   Diagnosis Date Noted  . Neck pain 04/16/2015  . Muscle spasm of back 04/16/2015  . Muscle strain of left upper back 04/16/2015  . Overweight (BMI 25.0-29.9) 10/23/2014  . Intertrigo 06/01/2014  . Herpes simplex 05/14/2014  . Abnormal weight gain 04/24/2014    Misheel Gowans Rober Minion PT, MPH  04/19/2015, 5:38 PM  [facility] [address] [place], [place], [address] Phone: [phone]   Fax:  [phone]  Name: Renee Hopkins MRN: [id number] Date of Birth: 1965/06/08

## 2015-04-19 NOTE — Patient Instructions (Signed)
Scapula Adduction With Pectoralis Stretch: Low - Standing   Shoulders at 45 hands even with shoulders, keeping weight through legs, shift weight forward until you feel pull or stretch through the front of your chest. Hold _30__ seconds. Do _3__ times, _2-4__ times per day.   Scapula Adduction With Pectoralis Stretch: Mid-Range - Standing   Shoulders at 90 elbows even with shoulders, keeping weight through legs, shift weight forward until you feel pull or strength through the front of your chest. Hold __30_ seconds. Do _3__ times, __2-4_ times per day.   Scapula Adduction With Pectoralis Stretch: High - Standing   Shoulders at 120 hands up high on the doorway, keeping weight on feet, shift weight forward until you feel pull or stretch through the front of your chest. Hold _30__ seconds. Do _3__ times, _2-3__ times per day.  

## 2015-04-26 ENCOUNTER — Encounter: Payer: BC Managed Care – PPO | Admitting: Rehabilitative and Restorative Service Providers"

## 2015-04-30 ENCOUNTER — Ambulatory Visit (INDEPENDENT_AMBULATORY_CARE_PROVIDER_SITE_OTHER): Payer: BC Managed Care – PPO | Admitting: Physical Therapy

## 2015-04-30 ENCOUNTER — Encounter: Payer: Self-pay | Admitting: Physician Assistant

## 2015-04-30 DIAGNOSIS — M623 Immobility syndrome (paraplegic): Secondary | ICD-10-CM | POA: Diagnosis not present

## 2015-04-30 DIAGNOSIS — R293 Abnormal posture: Secondary | ICD-10-CM

## 2015-04-30 DIAGNOSIS — M2560 Stiffness of unspecified joint, not elsewhere classified: Secondary | ICD-10-CM

## 2015-04-30 DIAGNOSIS — Z7409 Other reduced mobility: Secondary | ICD-10-CM

## 2015-04-30 DIAGNOSIS — M25512 Pain in left shoulder: Secondary | ICD-10-CM | POA: Diagnosis not present

## 2015-04-30 NOTE — Therapy (Addendum)
[place] [facility] [place], [place], [address] Phone: [phone]   Fax:  [phone]  Physical Therapy Treatment  Patient Details  Name: Renee Hopkins MRN: [id number] Date of Birth: [birth year] Referring Provider: Iran Planas PCA  Encounter Date: 04/30/2015      PT End of Session - 04/30/15 1525    Visit Number 3   Number of Visits 12   Date for PT Re-Evaluation 05/25/15   PT Start Time 1520   PT Stop Time 1616   PT Time Calculation (min) 56 min   Activity Tolerance Patient limited by pain      No past medical history on file.  Past Surgical History  Procedure Laterality Date  . Shoulder surgery      There were no vitals filed for this visit.  Visit Diagnosis:  Shoulder pain, left  Stiffness due to immobility  Abnormal posture  Decreased functional mobility and endurance      Subjective Assessment - 04/30/15 1525    Subjective Pt reports, "It has been rough over the last week".  2 days after last session, things got worse and had increased symptoms into UE.  Unable to get relief. Difficult sleeping.    Currently in Pain? Yes   Pain Score 6    Pain Location Shoulder   Pain Orientation Left   Pain Descriptors / Indicators Throbbing   Pain Radiating Towards down to Lt first and 2nd finger    Aggravating Factors  working, reaching    Pain Relieving Factors heating pad, biofreeze, TENS            [facility] PT Assessment - 04/30/15 0001    Assessment   Medical Diagnosis Lt shoulder dysfunction   Onset Date/Surgical Date 10/09/15   Hand Dominance Right   Next MD Visit no appt scheduled          [facility] Adult PT Treatment/Exercise - 04/30/15 0001    Shoulder Exercises: Supine   Other Supine Exercises snow angel x 10, then prolonged stretch with arms abd to 90 deg.     Other Supine Exercises Lt UE nerve glides with arm abd and wrist flex/ext. x 10    Shoulder Exercises: Standing   Other  Standing Exercises Wall wash with assist from RUE x 5 reps    Other Standing Exercises lateral neck stretch then standing nerve glides with lateral neck flexion each way for LUE.    Shoulder Exercises: Pulleys   Flexion --  10 sec x 10    Other Pulley Exercises scaption 10 sec hold x 10   Shoulder Exercises: ROM/Strengthening   UBE (Upper Arm Bike) L1 x 4 min alt fwd/back   Shoulder Exercises: Stretch   Other Shoulder Stretches shoulder Ext stretch with towel assist x 20 sec x 2 reps    Other Shoulder Stretches Doorway mid and high level x 30 sec x 2 reps    Moist Heat Therapy   Number Minutes Moist Heat --   Moist Heat Location --   Electrical Stimulation   Electrical Stimulation Location Lt shoulder / cervical paraspinals.    Electrical Stimulation Action IFC    Electrical Stimulation Parameters to tolerance    Electrical Stimulation Goals Pain;Tone   Manual Therapy   Manual Therapy Manual Traction;Myofascial release;Soft tissue mobilization   Manual therapy comments pt hooklying and very guarded    Soft tissue mobilization to bilat cervical paraspinals    Myofascial Release Lt scalene, upper trap, levator, rhomboid, suboccipital  release.    Manual Traction light cervical traction                 PT Education - 04/30/15 1608    Education provided Yes   Education Details Ice/ heat application    Person(s) Educated Patient   Methods Explanation   Comprehension Verbalized understanding             PT Long Term Goals - 04/19/15 1734    PT LONG TERM GOAL #1   Title Decrease pain allowing pt to sleep and work with min to no pain 05/25/15   Time 6   Period Weeks   Status On-going   PT LONG TERM GOAL #2   Title Increase Lt shd ROM to equal or greater than Rt shd ROM 05/25/15   Time 6   Period Weeks   Status On-going   PT LONG TERM GOAL #3   Title Patient reports return to normal functional activities including laundry, washing dishes; sitting/standing/walking >  30 min 05/25/15   Time 6   Period Weeks   Status On-going   PT LONG TERM GOAL #4   Title I in HEP 05/25/15   Time 6   Period Weeks   Status On-going   PT LONG TERM GOAL #5   Title Improve FOTO to </= 34% limitation 05/25/15   Time 6   Period Weeks   Status On-going               Plan - 04/30/15 1656    Clinical Impression Statement Pt presents with increased Lt shoulder/neck pain and radicular symptoms into Lt hand.  Pt very point tender in Lt rhomboid, cervical paraspinals and scalenes; some increased symptoms with attempts at manual traction and MFR to Lt scalenes.    Limited progress this visit.     Pt will benefit from skilled therapeutic intervention in order to improve on the following deficits Postural dysfunction;Improper body mechanics;Decreased range of motion;Decreased mobility;Increased fascial restricitons;Pain;Decreased endurance;Decreased activity tolerance   Rehab Potential Good   PT Frequency 2x / week   PT Duration 8 weeks   PT Treatment/Interventions Patient/family education;ADLs/Self Care Home Management;Therapeutic exercise;Therapeutic activities;Manual techniques;Dry needling;Neuromuscular re-education;Cryotherapy;Electrical Stimulation;Iontophoresis 45m/ml Dexamethasone;Moist Heat;Ultrasound   PT Next Visit Plan progress with postural correction and posterior shd girdle strengthening as tolerated; work on Lt shd ROM; manual therapy and joint mobs through shoudler and cervical spine; TDN as indicated   PT Home Exercise Plan ice/ TENS and self massage to upper back and Lt shoulder.    Consulted and Agree with Plan of Care Patient        Problem List Patient Active Problem List   Diagnosis Date Noted  . Neck pain 04/16/2015  . Muscle spasm of back 04/16/2015  . Muscle strain of left upper back 04/16/2015  . Overweight (BMI 25.0-29.9) 10/23/2014  . Intertrigo 06/01/2014  . Herpes simplex 05/14/2014  . Abnormal weight gain 04/24/2014    JKerin Perna PTA 04/30/2015 5:03 PM  [place] [address] [place] [address]Phone: [phone]  Fax:  [phone] Name: Renee KelsoMRN: [id number] of Birth: 1Jun 28, 1967   PHYSICAL THERAPY DISCHARGE SUMMARY  Visits from Start of Care: 3  Current functional level related to goals / functional outcomes: Continued symptoms - scheduled for epidural injection    Remaining deficits: Continued pain and limitations   Education / Equipment: HEP Plan: Patient agrees to discharge.  Patient goals were  not met. Patient is being discharged due to lack of progress.  ?????   Celyn P. Helene Kelp PT, MPH 05/31/2015 9:27 AM

## 2015-05-03 ENCOUNTER — Ambulatory Visit (INDEPENDENT_AMBULATORY_CARE_PROVIDER_SITE_OTHER): Payer: BC Managed Care – PPO | Admitting: Sports Medicine

## 2015-05-03 ENCOUNTER — Encounter: Payer: Self-pay | Admitting: Sports Medicine

## 2015-05-03 DIAGNOSIS — M5412 Radiculopathy, cervical region: Secondary | ICD-10-CM

## 2015-05-03 MED ORDER — PREDNISONE 50 MG PO TABS: ORAL_TABLET | ORAL | Status: DC

## 2015-05-03 NOTE — Progress Notes (Signed)
   Subjective:    I'm seeing this patient as a consultation for:  Renee GawJade Breeback, PA-C  CC: Neck and left arm pain  HPI: For several months this pleasant 50 year old female has had pain in her neck with radiation around the shoulder blade and down the arm and C7 distribution. She's had rated in 6 weeks of physician directed physical therapy, oral NSAIDs, tramadol without any improvement. She is referred to me for further evaluation and definitive treatment, no lower extremity symptoms, trauma, bowel or bladder dysfunction, or constitutional symptoms.  Past medical history, Surgical history, Family history not pertinant except as noted below, Social history, Allergies, and medications have been entered into the medical record, reviewed, and no changes needed.   Review of Systems: No headache, visual changes, nausea, vomiting, diarrhea, constipation, dizziness, abdominal pain, skin rash, fevers, chills, night sweats, weight loss, swollen lymph nodes, body aches, joint swelling, muscle aches, chest pain, shortness of breath, mood changes, visual or auditory hallucinations.   Objective:   General: Well Developed, well nourished, and in no acute distress.  Neuro/Psych: Alert and oriented x3, extra-ocular muscles intact, able to move all 4 extremities, sensation grossly intact. Skin: Warm and dry, no rashes noted.  Respiratory: Not using accessory muscles, speaking in full sentences, trachea midline.  Cardiovascular: Pulses palpable, no extremity edema. Abdomen: Does not appear distended. Neck: Negative spurling's Full neck range of motion Grip strength and sensation normal in bilateral hands Strength good C4 to T1 distribution No sensory change to C4 to T1 Reflexes normal  Impression and Recommendations:   This case required medical decision making of moderate complexity.

## 2015-05-03 NOTE — Assessment & Plan Note (Signed)
Has failed physical therapy, tramadol, at this point we are going to add prednisone and obtain an MRI of the cervical spine for interventional planning, radiculopathy is C7.  Return to see me to go over MRI results.

## 2015-05-07 ENCOUNTER — Ambulatory Visit (INDEPENDENT_AMBULATORY_CARE_PROVIDER_SITE_OTHER): Payer: BC Managed Care – PPO

## 2015-05-07 ENCOUNTER — Telehealth: Payer: Self-pay | Admitting: Sports Medicine

## 2015-05-07 ENCOUNTER — Encounter: Payer: BC Managed Care – PPO | Admitting: Physical Therapy

## 2015-05-07 ENCOUNTER — Encounter: Payer: Self-pay | Admitting: Physician Assistant

## 2015-05-07 ENCOUNTER — Ambulatory Visit (INDEPENDENT_AMBULATORY_CARE_PROVIDER_SITE_OTHER): Payer: BC Managed Care – PPO | Admitting: Physician Assistant

## 2015-05-07 VITALS — BP 130/82 | HR 92 | Ht 70.0 in | Wt 197.0 lb

## 2015-05-07 DIAGNOSIS — M5412 Radiculopathy, cervical region: Secondary | ICD-10-CM

## 2015-05-07 DIAGNOSIS — E663 Overweight: Secondary | ICD-10-CM | POA: Diagnosis not present

## 2015-05-07 DIAGNOSIS — R635 Abnormal weight gain: Secondary | ICD-10-CM | POA: Diagnosis not present

## 2015-05-07 DIAGNOSIS — M50323 Other cervical disc degeneration at C6-C7 level: Secondary | ICD-10-CM

## 2015-05-07 MED ORDER — PHENTERMINE HCL 37.5 MG PO TABS: 37.5000 mg | ORAL_TABLET | Freq: Every day | ORAL | Status: DC

## 2015-05-07 MED ORDER — PREDNISONE 10 MG (21) PO TBPK: ORAL_TABLET | ORAL | Status: DC

## 2015-05-07 NOTE — Telephone Encounter (Signed)
Patient is still in pain, MRI is scheduled for today , requests a refill on prednisone.

## 2015-05-07 NOTE — Progress Notes (Signed)
   Subjective:    Patient ID: Renee Hopkins, female    DOB: 09/13/1965, 50 y.o.   MRN: [id number]  HPI  Pt presents to the clinic to restart phentermine for weight loss. Pt lost 48lbs when tried phentermine last year. She stopped in September. She has gained 12lbs back and would like to try for a few months. She has not been exercising and not dieting. Holidays were hard for her.     Review of Systems See HPI.    Objective:   Physical Exam  Constitutional: She is oriented to person, place, and time. She appears well-developed and well-nourished.  Overweight.  HENT:  Head: Normocephalic and atraumatic.  Cardiovascular: Normal rate, regular rhythm and normal heart sounds.   Pulmonary/Chest: Effort normal and breath sounds normal.  Neurological: She is alert and oriented to person, place, and time.  Skin: Skin is dry.  Psychiatric: She has a normal mood and affect. Her behavior is normal.          Assessment & Plan:  Abnormal weight gain/overweight- restarted phentermine for one month. Pt aware of side effects. Encouraged calorie restriction to 1500 a day. Start back exercising. Recheck nurse visit one month.

## 2015-05-10 ENCOUNTER — Encounter: Payer: Self-pay | Admitting: Sports Medicine

## 2015-05-10 ENCOUNTER — Ambulatory Visit (INDEPENDENT_AMBULATORY_CARE_PROVIDER_SITE_OTHER): Payer: BC Managed Care – PPO | Admitting: Sports Medicine

## 2015-05-10 DIAGNOSIS — M5412 Radiculopathy, cervical region: Secondary | ICD-10-CM | POA: Diagnosis not present

## 2015-05-10 MED ORDER — HYDROCODONE-ACETAMINOPHEN 5-325 MG PO TABS: 1.0000 | ORAL_TABLET | Freq: Three times a day (TID) | ORAL | Status: DC | PRN

## 2015-05-10 NOTE — Assessment & Plan Note (Addendum)
Large disc protrusions at C5-6, and larger so at C6-C7 with indentation of the thecal sac. Has not responded to prednisone, rehabilitation. At this point we are going to proceed with a left-sided C6-C7 interlaminar epidural. Return to see me one month after the epidural to evaluate response.  Alprazolam 1 mg 2 hours before and another pill 1 hour before the injection.

## 2015-05-10 NOTE — Progress Notes (Signed)
  Subjective:    CC: MRI results  HPI: Shawna OrleansMelanie is a pleasant 50 year old female with long-standing cervical radiculopathy, left C7, unfortunately she has failed greater than 6 weeks of physician directed physical therapy, oral medications include steroids, NSAIDs. We obtain an MRI for interventional planning, pain is moderate, persistent, no response to the prednisone.  Past medical history, Surgical history, Family history not pertinant except as noted below, Social history, Allergies, and medications have been entered into the medical record, reviewed, and no changes needed.   Review of Systems: No fevers, chills, night sweats, weight loss, chest pain, or shortness of breath.   Objective:    General: Well Developed, well nourished, and in no acute distress.  Neuro: Alert and oriented x3, extra-ocular muscles intact, sensation grossly intact.  HEENT: Normocephalic, atraumatic, pupils equal round reactive to light, neck supple, no masses, no lymphadenopathy, thyroid nonpalpable.  Skin: Warm and dry, no rashes. Cardiac: Regular rate and rhythm, no murmurs rubs or gallops, no lower extremity edema.  Respiratory: Clear to auscultation bilaterally. Not using accessory muscles, speaking in full sentences.  MRI personally reviewed, there are disc protrusions at C5-6, and worse at the C6-C7 level, causing bilateral foraminal stenosis and indentation of the thecal sac  Impression and Recommendations:   I spent 25 minutes with this patient, greater than 50% was face-to-face time counseling regarding the above diagnoses

## 2015-05-11 ENCOUNTER — Ambulatory Visit
Admission: RE | Admit: 2015-05-11 | Discharge: 2015-05-11 | Disposition: A | Discharge status: Home / Self Care | Payer: BC Managed Care – PPO | Source: Ambulatory Visit | Attending: Sports Medicine | Admitting: Sports Medicine

## 2015-05-11 MED ORDER — TRIAMCINOLONE ACETONIDE 40 MG/ML IJ SUSP (RADIOLOGY)
60.0000 mg | Freq: Once | INTRAMUSCULAR | Status: AC
  Administered 2015-05-11: 60 mg via EPIDURAL

## 2015-05-11 MED ORDER — IOPAMIDOL (ISOVUE-300) INJECTION 61%
1.0000 mL | Freq: Once | INTRAVENOUS | Status: AC | PRN
  Administered 2015-05-11: 1 mL via EPIDURAL

## 2015-05-11 MED ORDER — ALPRAZOLAM 1 MG PO TABS: ORAL_TABLET | ORAL | Status: DC

## 2015-05-11 NOTE — Discharge Instructions (Signed)

## 2015-05-11 NOTE — Addendum Note (Signed)
Addended by: Monica BectonHEKKEKANDAM, THOMAS J on: 05/11/2015 08:49 AM   Modules accepted: Orders

## 2015-05-14 ENCOUNTER — Other Ambulatory Visit: Payer: BC Managed Care – PPO

## 2015-05-28 ENCOUNTER — Encounter: Payer: Self-pay | Admitting: Sports Medicine

## 2015-05-28 DIAGNOSIS — M5030 Other cervical disc degeneration, unspecified cervical region: Secondary | ICD-10-CM

## 2015-06-04 ENCOUNTER — Ambulatory Visit (INDEPENDENT_AMBULATORY_CARE_PROVIDER_SITE_OTHER): Payer: BC Managed Care – PPO | Admitting: Physician Assistant

## 2015-06-04 VITALS — BP 118/75 | HR 94 | Wt 189.0 lb

## 2015-06-04 DIAGNOSIS — R635 Abnormal weight gain: Secondary | ICD-10-CM

## 2015-06-04 MED ORDER — PHENTERMINE HCL 37.5 MG PO TABS: 37.5000 mg | ORAL_TABLET | Freq: Every day | ORAL | Status: DC

## 2015-06-04 NOTE — Progress Notes (Signed)
Patient is here for blood pressure and weight check. Denies any trouble sleeping, palpitations, or any other medication problems. Patient has lost weight. A refill for Phentermine will be sent to patient preferred pharmacy. Patient advised to schedule a four week nurse visit and keep her upcoming appointment with her PCP. Verbalized understanding, no further questions. 

## 2015-06-11 ENCOUNTER — Encounter: Payer: Self-pay | Admitting: Sports Medicine

## 2015-06-11 ENCOUNTER — Ambulatory Visit (INDEPENDENT_AMBULATORY_CARE_PROVIDER_SITE_OTHER): Payer: BC Managed Care – PPO | Admitting: Sports Medicine

## 2015-06-11 DIAGNOSIS — M5412 Radiculopathy, cervical region: Secondary | ICD-10-CM | POA: Diagnosis not present

## 2015-06-11 MED ORDER — GABAPENTIN 300 MG PO CAPS: ORAL_CAPSULE | ORAL | Status: DC

## 2015-06-11 NOTE — Progress Notes (Signed)
  Subjective:    CC: follow-up  HPI: Cervical radiculopathy: C7, unfortunately at this point has failed all conservative measures including physical therapy, steroids, NSAIDs, muscle relaxers, she did fail a cervical epidural without any response, not even temporary. She is understanding that the next step is to consider surgical intervention, she is agreeable to try some neuropathic agents in the meantime as well.  Past medical history, Surgical history, Family history not pertinant except as noted below, Social history, Allergies, and medications have been entered into the medical record, reviewed, and no changes needed.   Review of Systems: No fevers, chills, night sweats, weight loss, chest pain, or shortness of breath.   Objective:    General: Well Developed, well nourished, and in no acute distress.  Neuro: Alert and oriented x3, extra-ocular muscles intact, sensation grossly intact.  HEENT: Normocephalic, atraumatic, pupils equal round reactive to light, neck supple, no masses, no lymphadenopathy, thyroid nonpalpable.  Skin: Warm and dry, no rashes. Cardiac: Regular rate and rhythm, no murmurs rubs or gallops, no lower extremity edema.  Respiratory: Clear to auscultation bilaterally. Not using accessory muscles, speaking in full sentences.  Impression and Recommendations:    I spent 25 minutes with this patient, greater than 50% was face-to-face time counseling regarding the above diagnoses

## 2015-06-11 NOTE — Patient Instructions (Signed)
[facility] Phone: [phone]

## 2015-06-11 NOTE — Assessment & Plan Note (Signed)
At this point Renee Hopkins has failed conservative measures including physical therapy, steroids, NSAIDs, muscle relaxers, epidural injections.  I think she is now a candidate for  Surgical intervention.  adding gabapentin, and she will await a call from [facility]

## 2015-07-05 ENCOUNTER — Ambulatory Visit (INDEPENDENT_AMBULATORY_CARE_PROVIDER_SITE_OTHER): Payer: BC Managed Care – PPO | Admitting: Physician Assistant

## 2015-07-05 VITALS — BP 121/79 | HR 98 | Ht 70.0 in | Wt 188.0 lb

## 2015-07-05 DIAGNOSIS — R635 Abnormal weight gain: Secondary | ICD-10-CM | POA: Diagnosis not present

## 2015-07-05 MED ORDER — PHENTERMINE HCL 37.5 MG PO TABS: 37.5000 mg | ORAL_TABLET | Freq: Every day | ORAL | Status: DC

## 2015-07-05 NOTE — Progress Notes (Signed)
Patient was in office for weight and blood pressure check. Patient denied anything abnormal. Estelle Junehonda Cunningham,CMA   Will give one more month. If not losing this month need to consider other options. Follow up with myself in 1 month. Tandy GawJade Breeback PA-C

## 2015-07-05 NOTE — Addendum Note (Signed)
Addended by: Pixie CasinoUNNINGHAM, RHONDA C on: 07/05/2015 10:58 AM   Modules accepted: Level of Service

## 2015-07-06 ENCOUNTER — Other Ambulatory Visit: Payer: Self-pay | Admitting: Neurosurgery

## 2015-07-31 ENCOUNTER — Encounter (HOSPITAL_COMMUNITY): Payer: Self-pay

## 2015-07-31 ENCOUNTER — Encounter (HOSPITAL_COMMUNITY)
Admission: RE | Admit: 2015-07-31 | Discharge: 2015-07-31 | Disposition: A | Discharge status: Home / Self Care | Payer: BC Managed Care – PPO | Source: Ambulatory Visit | Attending: Neurosurgery | Admitting: Neurosurgery

## 2015-07-31 DIAGNOSIS — Z01812 Encounter for preprocedural laboratory examination: Secondary | ICD-10-CM | POA: Insufficient documentation

## 2015-07-31 DIAGNOSIS — M5020 Other cervical disc displacement, unspecified cervical region: Secondary | ICD-10-CM | POA: Diagnosis not present

## 2015-07-31 HISTORY — DX: Unspecified osteoarthritis, unspecified site: M19.90

## 2015-07-31 LAB — HCG, SERUM, QUALITATIVE: Preg, Serum: NEGATIVE

## 2015-07-31 LAB — CBC
HCT: 37.5 % (ref 36.0–46.0)
HEMOGLOBIN: 11.9 g/dL — AB (ref 12.0–15.0)
MCH: 26.3 pg (ref 26.0–34.0)
MCHC: 31.7 g/dL (ref 30.0–36.0)
MCV: 82.8 fL (ref 78.0–100.0)
Platelets: 277 10*3/uL (ref 150–400)
RBC: 4.53 MIL/uL (ref 3.87–5.11)
RDW: 13.3 % (ref 11.5–15.5)
WBC: 6.5 10*3/uL (ref 4.0–10.5)

## 2015-07-31 LAB — BASIC METABOLIC PANEL
ANION GAP: 8 (ref 5–15)
BUN: 12 mg/dL (ref 6–20)
CHLORIDE: 102 mmol/L (ref 101–111)
CO2: 27 mmol/L (ref 22–32)
Calcium: 9.1 mg/dL (ref 8.9–10.3)
Creatinine, Ser: 0.69 mg/dL (ref 0.44–1.00)
Glucose, Bld: 102 mg/dL — ABNORMAL HIGH (ref 65–99)
POTASSIUM: 3.8 mmol/L (ref 3.5–5.1)
SODIUM: 137 mmol/L (ref 135–145)

## 2015-07-31 LAB — SURGICAL PCR SCREEN
MRSA, PCR: NEGATIVE
STAPHYLOCOCCUS AUREUS: NEGATIVE

## 2015-07-31 NOTE — Progress Notes (Signed)
PCP: Dr. Alverda SkeansJay Breebach Pt with no complaints of shortness of breath or chest pain.

## 2015-07-31 NOTE — Pre-Procedure Instructions (Signed)
Marvia PicklesMelanie Brinlee  07/31/2015      [id number] - [place], [place] - [address] [place] [address] Phone: [phone] Fax: [phone]    Your procedure is scheduled on   Wednesday  08/08/15  [facility] at 630 A.M.  Call this number if you have problems the morning of surgery:  [phone]   Remember:  Do not eat food or drink liquids after midnight.  Take these medicines the morning of surgery with A SIP OF WATER: gabapentin (neurontin) if needed   STOP taking today: phentermine (Apidex)        (STOP 7 DAYS PRIOR TO SURGERY ASPIRIN, IBUPROFEN/ ADVIL/ MOTRIN, GOODY POWDERS/ BC'S, VITAMINS, HERBAL MEDICINES)   Do not wear jewelry, make-up or nail polish.  Do not wear lotions, powders, or perfumes.  You may wear deodorant.  Do not shave 48 hours prior to surgery.  Men may shave face and neck.  Do not bring valuables to the hospital.  [facility] is not responsible for any belongings or valuables.  Contacts, dentures or bridgework may not be worn into surgery.  Leave your suitcase in the car.  After surgery it may be brought to your room.  For patients admitted to the hospital, discharge time will be determined by your treatment team.  Patients discharged the day of surgery will not be allowed to drive home.   Name and phone number of your driver:    Special instructions:  [facility] - Preparing for Surgery  Before surgery, you can play an important role.  Because skin is not sterile, your skin needs to be as free of germs as possible.  You can reduce the number of germs on you skin by washing with CHG (chlorahexidine gluconate) soap before surgery.  CHG is an antiseptic cleaner which kills germs and bonds with the skin to continue killing germs even after washing.  Please DO NOT use if you have an allergy to CHG or antibacterial soaps.  If your skin becomes reddened/irritated stop using the CHG and  inform your nurse when you arrive at Short Stay.  Do not shave (including legs and underarms) for at least 48 hours prior to the first CHG shower.  You may shave your face.  Please follow these instructions carefully:   1.  Shower with CHG Soap the night before surgery and the                                morning of Surgery.  2.  If you choose to wash your hair, wash your hair first as usual with your       normal shampoo.  3.  After you shampoo, rinse your hair and body thoroughly to remove the                      Shampoo.  4.  Use CHG as you would any other liquid soap.  You can apply chg directly       to the skin and wash gently with scrungie or a clean washcloth.  5.  Apply the CHG Soap to your body ONLY FROM THE NECK DOWN.        Do not use on open wounds or open sores.  Avoid contact with your eyes,       ears, mouth and genitals (private parts).  Wash genitals (  private parts)       with your normal soap.  6.  Wash thoroughly, paying special attention to the area where your surgery        will be performed.  7.  Thoroughly rinse your body with warm water from the neck down.  8.  DO NOT shower/wash with your normal soap after using and rinsing off       the CHG Soap.  9.  Pat yourself dry with a clean towel.            10.  Wear clean pajamas.            11.  Place clean sheets on your bed the night of your first shower and do not        sleep with pets.  Day of Surgery  Do not apply any lotions/deoderants the morning of surgery.  Please wear clean clothes to the hospital/surgery center.    Please read over the following fact sheets that you were given. Pain Booklet, Coughing and Deep Breathing, Blood Transfusion Information, MRSA Information and Surgical Site Infection Prevention

## 2015-08-06 ENCOUNTER — Ambulatory Visit (INDEPENDENT_AMBULATORY_CARE_PROVIDER_SITE_OTHER): Payer: BC Managed Care – PPO | Admitting: Physician Assistant

## 2015-08-06 VITALS — BP 118/77 | HR 93 | Wt 188.0 lb

## 2015-08-06 DIAGNOSIS — R635 Abnormal weight gain: Secondary | ICD-10-CM | POA: Diagnosis not present

## 2015-08-06 NOTE — Progress Notes (Signed)
Patient is here for blood pressure and weight check. Denies any trouble sleeping, palpitations, or any other medication problems. Patient has lost not weight. Spoke with PCP, since Pt is having spinal surgery this week it was recommended to stop Phentermine. Pt advised once she is clear from surgery to work out again for her to contact our office and she can discuss with PCP about restarting weight loss medications. Verbalized understanding, no further questions.

## 2015-08-07 MED ORDER — CEFAZOLIN SODIUM-DEXTROSE 2-4 GM/100ML-% IV SOLN
2.0000 g | INTRAVENOUS | Status: AC
  Administered 2015-08-08: 2 g via INTRAVENOUS
  Filled 2015-08-07: qty 100

## 2015-08-08 ENCOUNTER — Ambulatory Visit (HOSPITAL_COMMUNITY): Payer: BC Managed Care – PPO | Admitting: Anesthesiology

## 2015-08-08 ENCOUNTER — Ambulatory Visit (HOSPITAL_COMMUNITY): Payer: BC Managed Care – PPO

## 2015-08-08 ENCOUNTER — Encounter (HOSPITAL_COMMUNITY)
Admission: RE | Disposition: A | Discharge status: Home / Self Care | Payer: Self-pay | Source: Ambulatory Visit | Attending: Neurosurgery

## 2015-08-08 ENCOUNTER — Ambulatory Visit (HOSPITAL_COMMUNITY)
Admission: RE | Admit: 2015-08-08 | Discharge: 2015-08-09 | Disposition: A | Discharge status: Home / Self Care | Payer: BC Managed Care – PPO | Source: Ambulatory Visit | Attending: Neurosurgery | Admitting: Neurosurgery

## 2015-08-08 ENCOUNTER — Encounter (HOSPITAL_COMMUNITY): Payer: Self-pay | Admitting: *Deleted

## 2015-08-08 DIAGNOSIS — Z79899 Other long term (current) drug therapy: Secondary | ICD-10-CM | POA: Insufficient documentation

## 2015-08-08 DIAGNOSIS — M50122 Cervical disc disorder at C5-C6 level with radiculopathy: Secondary | ICD-10-CM | POA: Insufficient documentation

## 2015-08-08 DIAGNOSIS — M4722 Other spondylosis with radiculopathy, cervical region: Secondary | ICD-10-CM | POA: Diagnosis not present

## 2015-08-08 DIAGNOSIS — Z419 Encounter for procedure for purposes other than remedying health state, unspecified: Secondary | ICD-10-CM

## 2015-08-08 HISTORY — PX: CERVICAL DISC ARTHROPLASTY: SHX587

## 2015-08-08 SURGERY — CERVICAL ANTERIOR DISC ARTHROPLASTY
Anesthesia: General

## 2015-08-08 MED ORDER — METOCLOPRAMIDE HCL 5 MG/ML IJ SOLN: 10.0000 mg | Freq: Once | INTRAMUSCULAR | Status: DC | PRN

## 2015-08-08 MED ORDER — LACTATED RINGERS IV SOLN: INTRAVENOUS | Status: DC

## 2015-08-08 MED ORDER — LACTATED RINGERS IV SOLN
INTRAVENOUS | Status: DC | PRN
  Administered 2015-08-08 (×2): via INTRAVENOUS

## 2015-08-08 MED ORDER — ONDANSETRON HCL 4 MG/2ML IJ SOLN
INTRAMUSCULAR | Status: AC
  Filled 2015-08-08: qty 2

## 2015-08-08 MED ORDER — MIDAZOLAM HCL 2 MG/2ML IJ SOLN
INTRAMUSCULAR | Status: DC | PRN
  Administered 2015-08-08: 2 mg via INTRAVENOUS

## 2015-08-08 MED ORDER — PROPOFOL 10 MG/ML IV BOLUS
INTRAVENOUS | Status: AC
  Filled 2015-08-08: qty 20

## 2015-08-08 MED ORDER — SODIUM CHLORIDE 0.9 % IV SOLN: 250.0000 mL | INTRAVENOUS | Status: DC

## 2015-08-08 MED ORDER — FENTANYL CITRATE (PF) 250 MCG/5ML IJ SOLN
INTRAMUSCULAR | Status: DC | PRN
  Administered 2015-08-08: 100 ug via INTRAVENOUS
  Administered 2015-08-08 (×3): 50 ug via INTRAVENOUS

## 2015-08-08 MED ORDER — EPHEDRINE SULFATE 50 MG/ML IJ SOLN
INTRAMUSCULAR | Status: DC | PRN
  Administered 2015-08-08: 15 mg via INTRAVENOUS
  Administered 2015-08-08 (×2): 10 mg via INTRAVENOUS

## 2015-08-08 MED ORDER — LIDOCAINE HCL (CARDIAC) 20 MG/ML IV SOLN
INTRAVENOUS | Status: DC | PRN
  Administered 2015-08-08: 100 mg via INTRAVENOUS

## 2015-08-08 MED ORDER — FENTANYL CITRATE (PF) 250 MCG/5ML IJ SOLN
INTRAMUSCULAR | Status: AC
  Filled 2015-08-08: qty 5

## 2015-08-08 MED ORDER — ACETAMINOPHEN 650 MG RE SUPP: 650.0000 mg | RECTAL | Status: DC | PRN

## 2015-08-08 MED ORDER — SODIUM CHLORIDE 0.9% FLUSH: 3.0000 mL | INTRAVENOUS | Status: DC | PRN

## 2015-08-08 MED ORDER — ROCURONIUM BROMIDE 100 MG/10ML IV SOLN
INTRAVENOUS | Status: DC | PRN
  Administered 2015-08-08: 50 mg via INTRAVENOUS

## 2015-08-08 MED ORDER — ZOLPIDEM TARTRATE 5 MG PO TABS: 5.0000 mg | ORAL_TABLET | Freq: Every evening | ORAL | Status: DC | PRN

## 2015-08-08 MED ORDER — LIDOCAINE-EPINEPHRINE 0.5 %-1:200000 IJ SOLN
INTRAMUSCULAR | Status: DC | PRN
  Administered 2015-08-08: 5 mL

## 2015-08-08 MED ORDER — DOCUSATE SODIUM 100 MG PO CAPS
100.0000 mg | ORAL_CAPSULE | Freq: Two times a day (BID) | ORAL | Status: DC
Start: 2015-08-08 — End: 2015-08-09
  Administered 2015-08-08: 100 mg via ORAL
  Filled 2015-08-08: qty 1

## 2015-08-08 MED ORDER — ONDANSETRON HCL 4 MG/2ML IJ SOLN
INTRAMUSCULAR | Status: DC | PRN
  Administered 2015-08-08: 4 mg via INTRAVENOUS

## 2015-08-08 MED ORDER — LIDOCAINE 2% (20 MG/ML) 5 ML SYRINGE
INTRAMUSCULAR | Status: AC
  Filled 2015-08-08: qty 5

## 2015-08-08 MED ORDER — MENTHOL 3 MG MT LOZG: 1.0000 | LOZENGE | OROMUCOSAL | Status: DC | PRN

## 2015-08-08 MED ORDER — ACETAMINOPHEN 325 MG PO TABS: 650.0000 mg | ORAL_TABLET | ORAL | Status: DC | PRN

## 2015-08-08 MED ORDER — DIAZEPAM 5 MG PO TABS
5.0000 mg | ORAL_TABLET | Freq: Four times a day (QID) | ORAL | Status: DC | PRN
  Administered 2015-08-08 – 2015-08-09 (×3): 5 mg via ORAL
  Filled 2015-08-08 (×3): qty 1

## 2015-08-08 MED ORDER — BISACODYL 5 MG PO TBEC: 5.0000 mg | DELAYED_RELEASE_TABLET | Freq: Every day | ORAL | Status: DC | PRN

## 2015-08-08 MED ORDER — MEPERIDINE HCL 25 MG/ML IJ SOLN: 6.2500 mg | INTRAMUSCULAR | Status: DC | PRN

## 2015-08-08 MED ORDER — DEXAMETHASONE SODIUM PHOSPHATE 10 MG/ML IJ SOLN
INTRAMUSCULAR | Status: AC
  Filled 2015-08-08: qty 1

## 2015-08-08 MED ORDER — POTASSIUM CHLORIDE IN NACL 20-0.9 MEQ/L-% IV SOLN
INTRAVENOUS | Status: DC
  Filled 2015-08-08 (×3): qty 1000

## 2015-08-08 MED ORDER — MIDAZOLAM HCL 2 MG/2ML IJ SOLN
INTRAMUSCULAR | Status: AC
  Filled 2015-08-08: qty 2

## 2015-08-08 MED ORDER — ROCURONIUM BROMIDE 50 MG/5ML IV SOLN
INTRAVENOUS | Status: AC
  Filled 2015-08-08: qty 1

## 2015-08-08 MED ORDER — FENTANYL CITRATE (PF) 100 MCG/2ML IJ SOLN
INTRAMUSCULAR | Status: AC
  Filled 2015-08-08: qty 2

## 2015-08-08 MED ORDER — EPHEDRINE 5 MG/ML INJ
INTRAVENOUS | Status: AC
  Filled 2015-08-08: qty 10

## 2015-08-08 MED ORDER — HYDROCODONE-ACETAMINOPHEN 5-325 MG PO TABS
1.0000 | ORAL_TABLET | ORAL | Status: DC | PRN
  Administered 2015-08-08: 1 via ORAL
  Administered 2015-08-08: 2 via ORAL
  Filled 2015-08-08: qty 2
  Filled 2015-08-08: qty 1

## 2015-08-08 MED ORDER — ONDANSETRON HCL 4 MG/2ML IJ SOLN
4.0000 mg | INTRAMUSCULAR | Status: DC | PRN
  Administered 2015-08-08 (×2): 4 mg via INTRAVENOUS
  Filled 2015-08-08 (×2): qty 2

## 2015-08-08 MED ORDER — GABAPENTIN 300 MG PO CAPS
300.0000 mg | ORAL_CAPSULE | Freq: Three times a day (TID) | ORAL | Status: DC
  Administered 2015-08-08 (×2): 300 mg via ORAL
  Filled 2015-08-08 (×2): qty 1

## 2015-08-08 MED ORDER — SODIUM CHLORIDE 0.9% FLUSH
3.0000 mL | Freq: Two times a day (BID) | INTRAVENOUS | Status: DC
  Administered 2015-08-08: 3 mL via INTRAVENOUS

## 2015-08-08 MED ORDER — FENTANYL CITRATE (PF) 100 MCG/2ML IJ SOLN
25.0000 ug | INTRAMUSCULAR | Status: DC | PRN
  Administered 2015-08-08: 50 ug via INTRAVENOUS

## 2015-08-08 MED ORDER — HEMOSTATIC AGENTS (NO CHARGE) OPTIME
TOPICAL | Status: DC | PRN
  Administered 2015-08-08: 1 via TOPICAL

## 2015-08-08 MED ORDER — MORPHINE SULFATE (PF) 2 MG/ML IV SOLN: 1.0000 mg | INTRAVENOUS | Status: DC | PRN

## 2015-08-08 MED ORDER — KETOROLAC TROMETHAMINE 30 MG/ML IJ SOLN
30.0000 mg | Freq: Four times a day (QID) | INTRAMUSCULAR | Status: DC
  Administered 2015-08-08 – 2015-08-09 (×3): 30 mg via INTRAVENOUS
  Filled 2015-08-08 (×3): qty 1

## 2015-08-08 MED ORDER — PHENOL 1.4 % MT LIQD
1.0000 | OROMUCOSAL | Status: DC | PRN
  Administered 2015-08-08: 1 via OROMUCOSAL
  Filled 2015-08-08: qty 177

## 2015-08-08 MED ORDER — DEXAMETHASONE SODIUM PHOSPHATE 10 MG/ML IJ SOLN
INTRAMUSCULAR | Status: DC | PRN
  Administered 2015-08-08: 10 mg via INTRAVENOUS

## 2015-08-08 MED ORDER — 0.9 % SODIUM CHLORIDE (POUR BTL) OPTIME
TOPICAL | Status: DC | PRN
  Administered 2015-08-08: 1000 mL

## 2015-08-08 MED ORDER — PROPOFOL 10 MG/ML IV BOLUS
INTRAVENOUS | Status: DC | PRN
  Administered 2015-08-08: 170 mg via INTRAVENOUS

## 2015-08-08 MED ORDER — SENNOSIDES-DOCUSATE SODIUM 8.6-50 MG PO TABS: 1.0000 | ORAL_TABLET | Freq: Every evening | ORAL | Status: DC | PRN

## 2015-08-08 MED ORDER — THROMBIN 5000 UNITS EX SOLR
CUTANEOUS | Status: DC | PRN
  Administered 2015-08-08 (×2): 5000 [IU] via TOPICAL

## 2015-08-08 SURGICAL SUPPLY — 70 items
BIT DRILL NEURO 2X3.1 SFT TUCH (MISCELLANEOUS) ×1 IMPLANT
BIT DRILL PRESTIGE (BIT) ×2 IMPLANT
BLADE CLIPPER SURG (BLADE) IMPLANT
BNDG GAUZE ELAST 4 BULKY (GAUZE/BANDAGES/DRESSINGS) IMPLANT
BUR DRUM 4.0 (BURR) ×4 IMPLANT
CANISTER SUCT 3000ML PPV (MISCELLANEOUS) ×2 IMPLANT
DECANTER SPIKE VIAL GLASS SM (MISCELLANEOUS) ×2 IMPLANT
DERMABOND ADVANCED (GAUZE/BANDAGES/DRESSINGS) ×1
DERMABOND ADVANCED .7 DNX12 (GAUZE/BANDAGES/DRESSINGS) ×1 IMPLANT
DISC CERVICAL PRESTIGE LP 7X18 (Neuro Prosthesis/Implant) ×2 IMPLANT
DISC PRESTIGE 6X18 (Neuro Prosthesis/Implant) ×2 IMPLANT
DRAPE C-ARM 42X72 X-RAY (DRAPES) ×2 IMPLANT
DRAPE C-ARMOR (DRAPES) ×2 IMPLANT
DRAPE LAPAROTOMY 100X72 PEDS (DRAPES) ×2 IMPLANT
DRAPE MICROSCOPE LEICA (MISCELLANEOUS) ×2 IMPLANT
DRAPE POUCH INSTRU U-SHP 10X18 (DRAPES) ×2 IMPLANT
DRAPE PROXIMA HALF (DRAPES) IMPLANT
DRILL NEURO 2X3.1 SOFT TOUCH (MISCELLANEOUS) ×2
DURAPREP 6ML APPLICATOR 50/CS (WOUND CARE) ×2 IMPLANT
ELECT COATED BLADE 2.86 ST (ELECTRODE) ×2 IMPLANT
ELECT REM PT RETURN 9FT ADLT (ELECTROSURGICAL) ×2
ELECTRODE REM PT RTRN 9FT ADLT (ELECTROSURGICAL) ×1 IMPLANT
GAUZE SPONGE 4X4 16PLY XRAY LF (GAUZE/BANDAGES/DRESSINGS) IMPLANT
GLOVE BIO SURGEON STRL SZ 6.5 (GLOVE) IMPLANT
GLOVE BIO SURGEON STRL SZ7 (GLOVE) ×2 IMPLANT
GLOVE BIO SURGEON STRL SZ7.5 (GLOVE) IMPLANT
GLOVE BIO SURGEON STRL SZ8 (GLOVE) IMPLANT
GLOVE BIO SURGEON STRL SZ8.5 (GLOVE) IMPLANT
GLOVE BIOGEL M 8.0 STRL (GLOVE) IMPLANT
GLOVE BIOGEL PI IND STRL 6.5 (GLOVE) ×2 IMPLANT
GLOVE BIOGEL PI INDICATOR 6.5 (GLOVE) ×2
GLOVE ECLIPSE 6.5 STRL STRAW (GLOVE) ×2 IMPLANT
GLOVE ECLIPSE 7.0 STRL STRAW (GLOVE) IMPLANT
GLOVE ECLIPSE 7.5 STRL STRAW (GLOVE) IMPLANT
GLOVE ECLIPSE 8.0 STRL XLNG CF (GLOVE) IMPLANT
GLOVE ECLIPSE 8.5 STRL (GLOVE) ×2 IMPLANT
GLOVE EXAM NITRILE LRG STRL (GLOVE) IMPLANT
GLOVE EXAM NITRILE MD LF STRL (GLOVE) IMPLANT
GLOVE EXAM NITRILE XL STR (GLOVE) IMPLANT
GLOVE EXAM NITRILE XS STR PU (GLOVE) IMPLANT
GLOVE INDICATOR 6.5 STRL GRN (GLOVE) IMPLANT
GLOVE INDICATOR 7.0 STRL GRN (GLOVE) ×4 IMPLANT
GLOVE INDICATOR 7.5 STRL GRN (GLOVE) ×4 IMPLANT
GLOVE INDICATOR 8.0 STRL GRN (GLOVE) IMPLANT
GLOVE INDICATOR 8.5 STRL (GLOVE) ×2 IMPLANT
GLOVE OPTIFIT SS 8.0 STRL (GLOVE) IMPLANT
GLOVE SURG SS PI 6.5 STRL IVOR (GLOVE) IMPLANT
GOWN STRL REUS W/ TWL LRG LVL3 (GOWN DISPOSABLE) ×2 IMPLANT
GOWN STRL REUS W/ TWL XL LVL3 (GOWN DISPOSABLE) IMPLANT
GOWN STRL REUS W/TWL 2XL LVL3 (GOWN DISPOSABLE) ×2 IMPLANT
GOWN STRL REUS W/TWL LRG LVL3 (GOWN DISPOSABLE) ×2
GOWN STRL REUS W/TWL XL LVL3 (GOWN DISPOSABLE)
KIT BASIN OR (CUSTOM PROCEDURE TRAY) ×2 IMPLANT
KIT ROOM TURNOVER OR (KITS) ×2 IMPLANT
LIQUID BAND (GAUZE/BANDAGES/DRESSINGS) ×2 IMPLANT
NEEDLE HYPO 25X1 1.5 SAFETY (NEEDLE) ×2 IMPLANT
NEEDLE SPNL 22GX3.5 QUINCKE BK (NEEDLE) ×2 IMPLANT
NS IRRIG 1000ML POUR BTL (IV SOLUTION) ×2 IMPLANT
PACK LAMINECTOMY NEURO (CUSTOM PROCEDURE TRAY) ×2 IMPLANT
PAD ARMBOARD 7.5X6 YLW CONV (MISCELLANEOUS) ×6 IMPLANT
PIN DISTRACTION 14MM (PIN) ×4 IMPLANT
RUBBERBAND STERILE (MISCELLANEOUS) ×4 IMPLANT
SPONGE INTESTINAL PEANUT (DISPOSABLE) ×2 IMPLANT
SPONGE SURGIFOAM ABS GEL SZ50 (HEMOSTASIS) ×2 IMPLANT
SUT VIC AB 0 CT1 27 (SUTURE) ×2
SUT VIC AB 0 CT1 27XBRD ANTBC (SUTURE) ×2 IMPLANT
SUT VIC AB 3-0 SH 8-18 (SUTURE) ×2 IMPLANT
TOWEL OR 17X24 6PK STRL BLUE (TOWEL DISPOSABLE) ×2 IMPLANT
TOWEL OR 17X26 10 PK STRL BLUE (TOWEL DISPOSABLE) ×2 IMPLANT
WATER STERILE IRR 1000ML POUR (IV SOLUTION) ×2 IMPLANT

## 2015-08-08 NOTE — Anesthesia Procedure Notes (Signed)
Procedure Name: Intubation Date/Time: 08/08/2015 9:11 AM Performed by: Jefm MilesENNIE, Zuria Fosdick E Pre-anesthesia Checklist: Patient identified, Emergency Drugs available, Suction available, Patient being monitored and Timeout performed Patient Re-evaluated:Patient Re-evaluated prior to inductionOxygen Delivery Method: Circle system utilized Preoxygenation: Pre-oxygenation with 100% oxygen Intubation Type: IV induction Ventilation: Mask ventilation without difficulty Laryngoscope Size: Mac and 3 Grade View: Grade I Tube type: Oral Tube size: 7.0 mm Number of attempts: 1 Airway Equipment and Method: Stylet Placement Confirmation: ETT inserted through vocal cords under direct vision,  positive ETCO2 and breath sounds checked- equal and bilateral Secured at: 21 cm Tube secured with: Tape Dental Injury: Teeth and Oropharynx as per pre-operative assessment

## 2015-08-08 NOTE — Anesthesia Preprocedure Evaluation (Addendum)
Anesthesia Evaluation  Patient identified by MRN, date of birth, ID band Patient awake    Reviewed: Allergy & Precautions, NPO status , Patient's Chart, lab work & pertinent test results  Airway Mallampati: II  TM Distance: >3 FB Neck ROM: Full    Dental no notable dental hx. (+) Teeth Intact, Dental Advisory Given, Chipped,    Pulmonary neg pulmonary ROS,    Pulmonary exam normal breath sounds clear to auscultation       Cardiovascular negative cardio ROS Normal cardiovascular exam Rhythm:Regular Rate:Normal     Neuro/Psych negative neurological ROS  negative psych ROS   GI/Hepatic negative GI ROS, Neg liver ROS,   Endo/Other  negative endocrine ROS  Renal/GU negative Renal ROS  negative genitourinary   Musculoskeletal negative musculoskeletal ROS (+)   Abdominal   Peds negative pediatric ROS (+)  Hematology negative hematology ROS (+)   Anesthesia Other Findings   Reproductive/Obstetrics negative OB ROS                           Anesthesia Physical Anesthesia Plan  ASA: II  Anesthesia Plan: General   Post-op Pain Management:    Induction: Intravenous  Airway Management Planned: Oral ETT  Additional Equipment:   Intra-op Plan:   Post-operative Plan: Extubation in OR  Informed Consent: I have reviewed the patients History and Physical, chart, labs and discussed the procedure including the risks, benefits and alternatives for the proposed anesthesia with the patient or authorized representative who has indicated his/her understanding and acceptance.   Dental advisory given  Plan Discussed with: CRNA  Anesthesia Plan Comments:         Anesthesia Quick Evaluation

## 2015-08-08 NOTE — Transfer of Care (Signed)
Immediate Anesthesia Transfer of Care Note  Patient: Renee PicklesMelanie Delcastillo  Procedure(s) Performed: Procedure(s): Cervical five-six Cervical six-seven  Anterior cervical Arthroplasty (N/A)  Patient Location: PACU  Anesthesia Type:General  Level of Consciousness: lethargic and responds to stimulation  Airway & Oxygen Therapy: Patient Spontanous Breathing and Patient connected to nasal cannula oxygen  Post-op Assessment: Report given to RN and Patient moving all extremities X 4  Post vital signs: Reviewed and stable  Last Vitals:  Filed Vitals:   08/08/15 0702  BP: 121/70  Pulse: 93  Temp: 36.9 C  Resp: 18    Last Pain: There were no vitals filed for this visit.    Patients Stated Pain Goal: 5 (08/08/15 0707)  Complications: No apparent anesthesia complications

## 2015-08-08 NOTE — Op Note (Signed)
08/08/2015  2:59 PM  PATIENT:  Renee Hopkins  50 y.o. female  PRE-OPERATIVE DIAGNOSIS:  Cervical herniated disc C5/6, 6/7 left  POST-OPERATIVE DIAGNOSIS:  Cervical herniated disc left C5/6,6/7  PROCEDURE:  Anterior Cervical decompression C5/6,6/7 Arthroplasty C5/6 6x3018mm prestige,  C6/7 7x18 prestige   SURGEON:   Surgeon(s): Coletta MemosKyle Crecencio Kwiatek, MD Barnett AbuHenry Elsner, MD   ASSISTANTS:Elsner, Sherilyn CooterHenry  ANESTHESIA:   general  EBL:  Total I/O In: 1200 [I.V.:1200] Out: 100 [Blood:100]  BLOOD ADMINISTERED:none  CELL SAVER GIVEN:none  COUNT:per nursing  DRAINS: none   SPECIMEN:  No Specimen  DICTATION: [name] was taken to the operating room, intubated, and placed under general anesthesia without difficulty. She was positioned supine with her head in slight extension on a horseshoe headrest. The neck was prepped and draped in a sterile manner. I infiltrated 3 cc's 1/2%lidocaine/1:200,000 strength epinephrine into the planned incision starting from the midline to the medial border of the left sternocleidomastoid muscle. I opened the incision with a 10 blade and dissected sharply through soft tissue to the platysma. I dissected in the plane superior to the platysma both rostrally and caudally. I then opened the platysma in a horizontal fashion with Metzenbaum scissors, and dissected in the inferior plane rostrally and caudally. With both blunt and sharp technique I created an avascular corridor to the cervical spine. I placed a spinal needle(s) in the disc space at 5/6 . I then reflected the longus colli from C5 to C7 and placed self retaining retractors. I opened the disc space(s) at 5/6, and 6/7 with a 15 blade. I removed disc with curettes, Kerrison punches, and the drill. Using the drill I removed osteophytes and prepared for the decompression.  I decompressed the spinal canal and the C6, and 7 root(s) with the drill, Kerrison punches, and the curettes. I used the microscope to aid in  microdissection.We removed the posterior longitudinal ligament to fully expose and decompress the thecal sac. I exposed the roots laterally taking down the 5/6, and 6/7 uncovertebral joints. With the decompression complete we moved on to the arthroplasty I used the drill to level the surfaces of C5,6,and 7. I removed soft tissue to prepare the disc space and the bony surfaces. I measured the space and placed a 7x2518mm artificial prestige disc(medtronic)in the 6/7  the disc space. I using the same technique with fluoroscopic guidance at C5/6 placed a 6x4218mm artificial disc.   Intraoperative xray showed the artificial discs to be in good position. I irrigated the wound, achieved hemostasis, and closed the wound in layers. I approximated the platysma, and the subcuticular plane with vicryl sutures. I used Dermabond for a sterile dressing.   PLAN OF CARE: Admit for overnight observation  PATIENT DISPOSITION:  PACU - hemodynamically stable.   Delay start of Pharmacological VTE agent (>24hrs) due to surgical blood loss or risk of bleeding:  yes

## 2015-08-08 NOTE — H&P (Signed)
BP 121/70 mmHg  Pulse 93  Temp(Src) 98.5 F (36.9 C) (Oral)  Resp 18  Ht 5\' 10"  (1.778 m)  Wt 85.276 kg (188 lb)  BMI 26.98 kg/m2  SpO2 99%   [name] is the wife of my patient, [name] whom I took to the operating room recently for a lumbar procedure.  [name] presents today for evaluation of pain which she has in her neck and on the left side, especially the left upper extremity.  She finds that her third and forth digits often become numb.  She has had physical therapy.  She has had medications.  She has also undergone cervical epidural injections.  This has been going on since at least February.  She has not missed any time at work.  Says that her symptoms are in the left shoulder, neck are around the left shoulderblade, and down the arm to her hand.  She did have right shoulder surgery in 2005.      MEDICATIONS:                         Hydrocodone, Phentermine, and Gabapentin.     ALLERGIES:                                         No known drug allergies.      DATA:                                                  MRI of the cervical spine shows a herniated disc at 5-6 and 6-7 and osteophytic projections which are closing off the neural foramen at both levels.  The 6-7 level is worse than 5-6, but both are bad and showing clear cut compromise of the nerve roots.  Spinal cord itself has a normal signal.  No abnormalities in the paraspinous soft tissue.           INTERVAL PFSH:                                  Family history significant for arthritis and hypercholesterolemia.  She is married and does have children.  She does not use tobacco.  She does not use alcohol.  She has no history of illicit drug use.               REVIEW OF SYSTEMS:                        Positive for night sweats, sore throat, cough, extremity weakness left upper extremity, numbness in the left upper extremity, back pain, and neck pain.  She denies constitutional, ear, nose, mouth, throat,  eye, respiratory, cardiovascular, gastrointestinal, genitourinary, endocrine, neurological, psychiatric, skin, musculoskeletal, hematologic, allergic, or reproductive problems.  She has had no bowel or bladder dysfunction.  She continues to use a TENS unit.        PHYSICAL EXAMINATION:                    Vital signs are as follows:  Height 5 feet 9 inches, weight 191 pounds, temperature is 99.1, respiratory rate  14, blood pressure is 128/73, pulse 97, pain 1/10.  On examination, she is alert, oriented x4, and answering all questions appropriately.  She is in some obvious distress.  Weakness in the left triceps at 4/5, otherwise 5/5 strength.  Proprioception is intact in both upper and lower extremities.  Romberg test is negative.  Muscle tone, bulk, and coordination is normal.  Reflexes 2+ biceps, triceps, brachial radialis, knees, and ankles.  Pupils equal, round, and reactive to light.  Full extraocular movements.  Full visual fields.  Hearing intact to voice.  Uvula elevates in the midline.  Shoulder shrug is normal.  Tongue protrudes in the midline.  Hearing intact to finger rub bilaterally.  Romberg is negative.  Gait is otherwise normal.  Tandem walking was performed with ease.         IMPRESSION/PLAN:                             I explained to [name] at length secondary to the weakness that she has in the triceps and the large osteophyte disc complex there, that I do believe this is the reason for the weakness and I would recommend that she undergo operative decompression.  She is going to give this just a little bit of thought and just figure out a time when she would like to do this.  We will try to get this done within two weeks of her letting me know that she would like to proceed.  Risks and benefits of bleeding, infection, no relief, paralysis, weakness, fusion failure, hardware failure, and other risks were explained.  She understands and wishes to proceed.

## 2015-08-08 NOTE — Anesthesia Postprocedure Evaluation (Signed)
Anesthesia Post Note  Patient: Renee Hopkins  Procedure(s) Performed: Procedure(s) (LRB): Cervical five-six Cervical six-seven  Anterior cervical Arthroplasty (N/A)  Patient location during evaluation: PACU Anesthesia Type: General Level of consciousness: awake and alert Pain management: pain level controlled Vital Signs Assessment: post-procedure vital signs reviewed and stable Respiratory status: spontaneous breathing, nonlabored ventilation, respiratory function stable and patient connected to nasal cannula oxygen Cardiovascular status: blood pressure returned to baseline and stable Postop Assessment: no signs of nausea or vomiting Anesthetic complications: no    Last Vitals:  Filed Vitals:   08/08/15 1320 08/08/15 1341  BP:  124/70  Pulse: 79 72  Temp: 36.4 C 36.4 C  Resp: 11 18    Last Pain:  Filed Vitals:   08/08/15 1419  PainSc: 2                  Phillips Groutarignan, Alyshia Kernan

## 2015-08-09 ENCOUNTER — Encounter (HOSPITAL_COMMUNITY): Payer: Self-pay | Admitting: Neurosurgery

## 2015-08-09 DIAGNOSIS — M50122 Cervical disc disorder at C5-C6 level with radiculopathy: Secondary | ICD-10-CM | POA: Diagnosis not present

## 2015-08-09 MED ORDER — TRAMADOL HCL 50 MG PO TABS
50.0000 mg | ORAL_TABLET | Freq: Four times a day (QID) | ORAL | Status: DC | PRN
  Administered 2015-08-09 (×2): 100 mg via ORAL
  Filled 2015-08-09 (×2): qty 2

## 2015-08-09 MED ORDER — TRAMADOL HCL 50 MG PO TABS: 50.0000 mg | ORAL_TABLET | Freq: Four times a day (QID) | ORAL | Status: DC

## 2015-08-09 MED ORDER — CYCLOBENZAPRINE HCL 10 MG PO TABS
10.0000 mg | ORAL_TABLET | Freq: Three times a day (TID) | ORAL | Status: DC | PRN
Start: 2015-08-09 — End: 2015-11-21

## 2015-08-09 MED ORDER — TRAMADOL HCL 50 MG PO TABS: 50.0000 mg | ORAL_TABLET | Freq: Four times a day (QID) | ORAL | Status: DC | PRN

## 2015-08-09 NOTE — Progress Notes (Signed)
Pt given D/C instructions with Rx's, verbal understanding was provided. Pt's IV was removed prior to D/C. Pt's incision is clean and dry with no sign of infection. Pt D/C'd home via wheelchair @ 1200 per MD order. Pt is stable @ D/C and has no other needs at this time. Rema FendtAshley Kennon Encinas, RN

## 2015-08-09 NOTE — Discharge Summary (Signed)
  Physician Discharge Summary  Patient ID: Renee Hopkins MRN: [id number] DOB/AGE: 49/06/1965 49 y.o.  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 Renee Hopkins was admitted for an uncomplicated Cervical arthroplasty at C5/6, and 6/7. Post op she has been doing well, is ambulating, voiding, and tolerating a regular diet. Her wound is clean, dry, and without signs of infection  Treatments: surgery: Anterior Cervical decompression C5/6,6/7 Arthroplasty C5/6 6x5418mm prestige,  C6/7 7x18 prestige   Discharge Exam: Blood pressure 107/50, pulse 112, temperature 98.2 F (36.8 C), temperature source Oral, resp. rate 18, height 5\' 10"  (1.778 m), weight 85.276 kg (188 lb), SpO2 99 %. General appearance: alert, cooperative, appears stated age and no distress Neurologic: Alert and oriented X 3, normal strength and tone. Normal symmetric reflexes. Normal coordination and gait    Disposition: Final discharge disposition not confirmed Cervical herniated disc    Medication List    ASK your doctor about these medications        gabapentin 300 MG capsule  Commonly known as:  NEURONTIN  Take 300 mg by mouth 3 (three) times daily.     phentermine 37.5 MG tablet  Commonly known as:  ADIPEX-P  Take 37.5 mg by mouth daily before breakfast.           Follow-up Information    Follow up with Aoki Wedemeyer L, MD.   Specialty:  Neurosurgery   Why:  call the office please to make an appointment   Contact information:   [address] [place] [address] [phone]       Signed: Carmela HurtCABBELL,Annaclaire Walsworth L 08/09/2015, 9:55 AM

## 2015-08-09 NOTE — Discharge Instructions (Signed)

## 2015-08-13 ENCOUNTER — Other Ambulatory Visit: Payer: Self-pay | Admitting: Physician Assistant

## 2015-08-13 DIAGNOSIS — Z139 Encounter for screening, unspecified: Secondary | ICD-10-CM

## 2015-09-05 ENCOUNTER — Ambulatory Visit (INDEPENDENT_AMBULATORY_CARE_PROVIDER_SITE_OTHER): Payer: BC Managed Care – PPO

## 2015-09-05 DIAGNOSIS — Z1231 Encounter for screening mammogram for malignant neoplasm of breast: Secondary | ICD-10-CM | POA: Diagnosis not present

## 2015-09-05 DIAGNOSIS — Z139 Encounter for screening, unspecified: Secondary | ICD-10-CM

## 2015-10-30 ENCOUNTER — Ambulatory Visit (INDEPENDENT_AMBULATORY_CARE_PROVIDER_SITE_OTHER): Payer: BC Managed Care – PPO | Admitting: Physician Assistant

## 2015-10-30 ENCOUNTER — Encounter: Payer: Self-pay | Admitting: Physician Assistant

## 2015-10-30 ENCOUNTER — Other Ambulatory Visit: Payer: Self-pay | Admitting: *Deleted

## 2015-10-30 VITALS — BP 134/69 | HR 112 | Ht 70.0 in | Wt 190.0 lb

## 2015-10-30 DIAGNOSIS — E663 Overweight: Secondary | ICD-10-CM

## 2015-10-30 DIAGNOSIS — N898 Other specified noninflammatory disorders of vagina: Secondary | ICD-10-CM

## 2015-10-30 LAB — WET PREP FOR TRICH, YEAST, CLUE
Clue Cells Wet Prep HPF POC: NONE SEEN
TRICH WET PREP: NONE SEEN
YEAST WET PREP: NONE SEEN

## 2015-10-30 MED ORDER — METRONIDAZOLE 500 MG PO TABS: 500.0000 mg | ORAL_TABLET | Freq: Two times a day (BID) | ORAL | 0 refills | Status: DC

## 2015-10-30 MED ORDER — PHENTERMINE HCL 37.5 MG PO TABS: 37.5000 mg | ORAL_TABLET | Freq: Every day | ORAL | 0 refills | Status: DC

## 2015-10-30 NOTE — Progress Notes (Signed)
   Subjective:    Patient ID: Marvia PicklesMelanie Palacios, female    DOB: 05/26/1965, 50 y.o.   MRN: [id number]  HPI Pt presents to the clinic with 2 week of vaginal discharge.Patient reports discharge that started after intercourse with her husband. She describes the discharge as thin and white. She denies any vaginal itching or pain. She denies any dysuria, fever, chills, abdominal pain or flank pain. She has not taken anything to make better.  Pt would like to start phentermine again to lose an extra 10lbs. She has tolerated well in the past. She has only gained 2lbs from original weight loss.    Review of Systems See HPI.     Objective:   Physical Exam  Constitutional: She is oriented to person, place, and time. She appears well-developed and well-nourished.  HENT:  Head: Normocephalic and atraumatic.  Cardiovascular: Normal rate, regular rhythm and normal heart sounds.   Pulmonary/Chest: Effort normal and breath sounds normal. She has no wheezes.  Neurological: She is alert and oriented to person, place, and time.  Psychiatric: She has a normal mood and affect. Her behavior is normal.          Assessment & Plan:  Vaginal discharge- Wet prep done stat. Will call with results. Sounds more like BV. Discussed boric acid after intercourse. HO given to maintain good pH. Pt declined STD testing.   Overweight/abnormal weight gain- phentermine restarted. Follow up in one month. Goal weight loss is another 10lbs. Discussed exercise and diet changes. Follow up in one month.

## 2015-11-09 ENCOUNTER — Ambulatory Visit (INDEPENDENT_AMBULATORY_CARE_PROVIDER_SITE_OTHER): Payer: BC Managed Care – PPO

## 2015-11-09 ENCOUNTER — Ambulatory Visit (INDEPENDENT_AMBULATORY_CARE_PROVIDER_SITE_OTHER): Payer: BC Managed Care – PPO | Admitting: Sports Medicine

## 2015-11-09 ENCOUNTER — Encounter: Payer: Self-pay | Admitting: Sports Medicine

## 2015-11-09 DIAGNOSIS — R937 Abnormal findings on diagnostic imaging of other parts of musculoskeletal system: Secondary | ICD-10-CM | POA: Diagnosis not present

## 2015-11-09 DIAGNOSIS — M5416 Radiculopathy, lumbar region: Secondary | ICD-10-CM

## 2015-11-09 DIAGNOSIS — M4806 Spinal stenosis, lumbar region: Secondary | ICD-10-CM

## 2015-11-09 MED ORDER — PREDNISONE 50 MG PO TABS: ORAL_TABLET | ORAL | 0 refills | Status: DC

## 2015-11-09 MED ORDER — MELOXICAM 15 MG PO TABS: ORAL_TABLET | ORAL | 3 refills | Status: DC

## 2015-11-09 NOTE — Progress Notes (Signed)
   Subjective:    I'm seeing this patient as a consultation for:  Renee GawJade Breeback, PA-C  CC: Back and leg pain  HPI: This is a pleasant 50 year old female, for the past several weeks she's had increasing pain in her low back worse with sitting, flexion, Valsalva with radiation down the left leg, posterior aspect of the back of the calf and not past the heel. Moderate, persistent. No bowel or bladder dysfunction, saddle numbness, no constitutional symptoms.  Past medical history, Surgical history, Family history not pertinant except as noted below, Social history, Allergies, and medications have been entered into the medical record, reviewed, and no changes needed.   Review of Systems: No headache, visual changes, nausea, vomiting, diarrhea, constipation, dizziness, abdominal pain, skin rash, fevers, chills, night sweats, weight loss, swollen lymph nodes, body aches, joint swelling, muscle aches, chest pain, shortness of breath, mood changes, visual or auditory hallucinations.   Objective:   General: Well Developed, well nourished, and in no acute distress.  Neuro/Psych: Alert and oriented x3, extra-ocular muscles intact, able to move all 4 extremities, sensation grossly intact. Skin: Warm and dry, no rashes noted.  Respiratory: Not using accessory muscles, speaking in full sentences, trachea midline.  Cardiovascular: Pulses palpable, no extremity edema. Abdomen: Does not appear distended. Back Exam:  Inspection: Unremarkable  Motion: Flexion 45 deg, Extension 45 deg, Side Bending to 45 deg bilaterally,  Rotation to 45 deg bilaterally  SLR laying: Negative  XSLR laying: Negative  Palpable tenderness: None. FABER: negative. Sensory change: Gross sensation intact to all lumbar and sacral dermatomes.  Reflexes: 2+ at both patellar tendons, 2+ at achilles tendons, Babinski's downgoing.  Strength at foot  Plantar-flexion: 5/5 Dorsi-flexion: 5/5 Eversion: 5/5 Inversion: 5/5  Leg strength    Quad: 5/5 Hamstring: 5/5 Hip flexor: 5/5 Hip abductors: 5/5  Gait unremarkable.  X-rays personally reviewed and show widespread mild endplate spurring consistent with degenerative disc disease.  Impression and Recommendations:   This case required medical decision making of moderate complexity.  Left lumbar radiculopathy Left S1, prednisone, meloxicam, x-rays, formal physical therapy, return in one month for MRI and interventional planning if no better.

## 2015-11-09 NOTE — Assessment & Plan Note (Signed)
Left S1, prednisone, meloxicam, x-rays, formal physical therapy, return in one month for MRI and interventional planning if no better.

## 2015-11-21 ENCOUNTER — Other Ambulatory Visit: Payer: Self-pay | Admitting: Physician Assistant

## 2015-11-21 ENCOUNTER — Ambulatory Visit (INDEPENDENT_AMBULATORY_CARE_PROVIDER_SITE_OTHER): Payer: BC Managed Care – PPO | Admitting: Physician Assistant

## 2015-11-21 ENCOUNTER — Ambulatory Visit (INDEPENDENT_AMBULATORY_CARE_PROVIDER_SITE_OTHER): Payer: BC Managed Care – PPO

## 2015-11-21 ENCOUNTER — Encounter: Payer: Self-pay | Admitting: Physician Assistant

## 2015-11-21 VITALS — BP 120/66 | HR 96 | Wt 192.0 lb

## 2015-11-21 DIAGNOSIS — M79644 Pain in right finger(s): Secondary | ICD-10-CM

## 2015-11-21 DIAGNOSIS — L03019 Cellulitis of unspecified finger: Secondary | ICD-10-CM

## 2015-11-21 DIAGNOSIS — L03011 Cellulitis of right finger: Secondary | ICD-10-CM | POA: Diagnosis not present

## 2015-11-21 DIAGNOSIS — M79646 Pain in unspecified finger(s): Secondary | ICD-10-CM | POA: Insufficient documentation

## 2015-11-21 DIAGNOSIS — M79645 Pain in left finger(s): Secondary | ICD-10-CM

## 2015-11-21 MED ORDER — DOXYCYCLINE HYCLATE 100 MG PO TABS: 100.0000 mg | ORAL_TABLET | Freq: Two times a day (BID) | ORAL | 0 refills | Status: DC

## 2015-11-21 MED ORDER — PREDNISONE 50 MG PO TABS: ORAL_TABLET | ORAL | 0 refills | Status: DC

## 2015-11-21 MED ORDER — KETOROLAC TROMETHAMINE 60 MG/2ML IM SOLN
60.0000 mg | Freq: Once | INTRAMUSCULAR | Status: AC
  Administered 2015-11-21: 60 mg via INTRAMUSCULAR

## 2015-11-21 NOTE — Progress Notes (Signed)
Subjective:     Patient ID: Renee Hopkins, female   DOB: 04/16/1965, 50 y.o.   MRN: [id number]  HPI Pt is a 50 yo female who presents to the clinic with left thumb pain. Monday she was cutting her cuticles and cut a little too dip on the lateral edge of the left thumb nail. No bleeding or bruising. Her left thumb started to swell and become painful Monday night. It continues to be painful and she has trouble flexing at joint. Rates pain 8/10 and could not sleep last night. Has taken some tylenol with little relief. Has not done any soaks.   Review of Systems    see HPI.  Objective:   Physical Exam     Assessment:     Renee Hopkins.Renee Hopkins.Renee Hopkins was seen today for puncture wound.  Diagnoses and all orders for this visit:  Felon of finger -     Cancel: DG Finger Thumb Left  Thumb pain, left -     Cancel: DG Finger Thumb Left -     ketorolac (TORADOL) injection 60 mg; Inject 2 mLs (60 mg total) into the muscle once.  Other orders -     doxycycline (VIBRA-TABS) 100 MG tablet; Take 1 tablet (100 mg total) by mouth 2 (two) times daily. For 10 days. -     predniSONE (DELTASONE) 50 MG tablet; Take one tablet for 5 days.       Plan:     Felon of finger- due to not overt redness, warmth or abscess consulted Dr. Karie Schwalbe due to suspicion of possible gout. Via ultrasound and PE unlikely gout but felon. See consult note. Treated with ABX, prednisone and toradol 60mg  in office. Use ibuprofen for pain. Encouraged epson salt soaks. Follow up if not improving.

## 2015-11-21 NOTE — Progress Notes (Signed)
   Subjective:    I'm seeing this patient as a consultation for:  Tandy GawJade Breeback, PA-C  CC: Right thumb swelling  HPI: For the past several days this pleasant 50 year old female with a history of thumb trauma in the distant past that had increasing pain and swelling at the medial nail fold and had of her right thumb. It's hot, swollen, she really has no pain at the joint itself. Drainage, no purulence, she was clipping her nails sometime recently. Pain is severe, persistent without radiation. No constitutional symptoms.  Past medical history:  Negative.  See flowsheet/record as well for more information.  Surgical history: Negative.  See flowsheet/record as well for more information.  Family history: Negative.  See flowsheet/record as well for more information.  Social history: Negative.  See flowsheet/record as well for more information.  Allergies, and medications have been entered into the medical record, reviewed, and no changes needed.   Review of Systems: No headache, visual changes, nausea, vomiting, diarrhea, constipation, dizziness, abdominal pain, skin rash, fevers, chills, night sweats, weight loss, swollen lymph nodes, body aches, joint swelling, muscle aches, chest pain, shortness of breath, mood changes, visual or auditory hallucinations.   Objective:   General: Well Developed, well nourished, and in no acute distress.  Neuro/Psych: Alert and oriented x3, extra-ocular muscles intact, able to move all 4 extremities, sensation grossly intact. Skin: Warm and dry, no rashes noted.  Respiratory: Not using accessory muscles, speaking in full sentences, trachea midline.  Cardiovascular: Pulses palpable, no extremity edema. Abdomen: Does not appear distended. Right hand: Thumb is diffusely swollen, there isn't much erythema, no tenderness at the interphalangeal joint, good motion somewhat limited by swelling, but no pain with resisted flexion or extension or passive flexion or  extension, there is severe tenderness at the medial nail fold as well as the pad. Neurovascularly intact distally.  Procedure: Diagnostic Ultrasound of right thumb Device: GE Logiq E  Findings: Noted isoechoic increased volume in the fat pad of the volar thumb, no evidence of effusion in the interphalangeal joint or in the flexor pollicis longus tendon sheath. Images permanently stored and available for review in the ultrasound unit.  Impression: Felon without evidence of infectious tenosynovitis or septic interphalangeal joint  Impression and Recommendations:   This case required medical decision making of moderate complexity.  Felon of finger Ultrasound shows edema and infiltration of the fat pad of the thumb, no evidence of joint effusion at the interphalangeal joint or tendon sheath effusion at the flexor pollicis longus. I have recommended analgesics, x-rays, doxycycline, and prednisone to primary treating provider. Follow-up to see me as needed.

## 2015-11-21 NOTE — Assessment & Plan Note (Signed)
Ultrasound shows edema and infiltration of the fat pad of the thumb, no evidence of joint effusion at the interphalangeal joint or tendon sheath effusion at the flexor pollicis longus. I have recommended analgesics, x-rays, doxycycline, and prednisone to primary treating provider. Follow-up to see me as needed.

## 2015-11-22 ENCOUNTER — Encounter: Payer: Self-pay | Admitting: Physical Therapy

## 2015-11-22 ENCOUNTER — Ambulatory Visit (INDEPENDENT_AMBULATORY_CARE_PROVIDER_SITE_OTHER): Payer: BC Managed Care – PPO | Admitting: Physical Therapy

## 2015-11-22 DIAGNOSIS — M25652 Stiffness of left hip, not elsewhere classified: Secondary | ICD-10-CM

## 2015-11-22 DIAGNOSIS — M25651 Stiffness of right hip, not elsewhere classified: Secondary | ICD-10-CM | POA: Diagnosis not present

## 2015-11-22 DIAGNOSIS — M6281 Muscle weakness (generalized): Secondary | ICD-10-CM

## 2015-11-22 NOTE — Patient Instructions (Addendum)
Quads / HF, Prone    Lie face down, knees together. Grasp one ankle with same-side hand. Use towel if needed to reach. Gently pull foot toward buttock. Hold _45__ seconds. Repeat _1__ times per session. Do _1__ sessions per day.  Supine: Leg Stretch with Strap (Super Advanced)    Lie on back with one leg straight. Hook strap around other foot. Straighten knee. Raise leg to maximal stretch and straighten knee further by tightening quadriceps. Slowly press other leg down as close to floor as possible. Keep lower abdominals tight. Hold _45__ seconds. Warning: Intense stretch. Stay within tolerance. Repeat _1__ times per session. Do _1__ sessions per day.  Outer Hip Stretch: Reclined IT Band Stretch (Strap)    Strap around opposite foot, pull across only as far as possible with shoulders on mat. Hold for __45__ secs. Repeat __1__ times each leg. Once a day  Pelvic Press    Place hands under belly between navel and pubic bone, palms up. Feel pressure on hands. Increase pressure on hands by pressing pelvis down. This is NOT a pelvic tilt. Hold _5-10__ seconds. Relax. Repeat _10__ times.  Leg Lift: One-Leg    Press pelvis down. Keep knee straight; lengthen and lift one leg (from waist). Do not twist body. Keep other leg down. Hold __1-2_ seconds. Relax. Repeat 10 time. Repeat with other leg. Once a day  Abdominal Bracing With Pelvic Floor (Hook-Lying)   With neutral spine, tighten pelvic floor and abdominals. Hold 5-10 seconds. Repeat __10_ times. Do _1__ times a day.  Hip External Rotation With Pillow: Transverse Plane Stability   One knee bent, one leg straight, on pillow. Slowly roll bent knee out. Be sure pelvis does not rotate. Do _10__ times. Restabilize pelvis. Repeat with other leg. Do _1-2__ sets, _1__ times per day.  [facility] [address] [place], [place] [address]  [phone] (office) [phone]  (fax)        .  Copyright  VHI. All rights reserved.

## 2015-11-22 NOTE — Therapy (Signed)
[facility] [place] [address] [place], [place], [address] Phone: [phone]   Fax:  [phone]  Physical Therapy Evaluation  Patient Details  Name: Renee Hopkins MRN: [id number] Date of Birth: 10-27-1965 Referring Provider: Dr Benjamin Stain  Encounter Date: 11/22/2015      PT End of Session - 11/22/15 1405    Visit Number 1   Number of Visits 4   Date for PT Re-Evaluation 12/20/15   PT Start Time 1405   PT Stop Time 1459   PT Time Calculation (min) 54 min      Past Medical History:  Diagnosis Date  . Arthritis    back    Past Surgical History:  Procedure Laterality Date  . CERVICAL DISC ARTHROPLASTY N/A 08/08/2015   Procedure: Cervical five-six Cervical six-seven  Anterior cervical Arthroplasty;  Surgeon: Coletta Memos, MD;  Location: [facility];  Service: Neurosurgery;  Laterality: N/A;  . CYSTECTOMY Right    shoulder  . SHOULDER SURGERY      There were no vitals filed for this visit.       Subjective Assessment - 11/22/15 1405    Subjective Pt reports long h/o LBP. Had neck surgery in June, in August she developed really bad LBP where she couldn't lift and move her Lt LE. This time it didn't go away. Started on meloxicam and is not sure it is helping.  Recently hurt her thumb, saw MD yesterday and was started on prednisone for this.  Today is a good day for her.  She hasn't had back pain in two days, however she has been babying it.    Diagnostic tests x-ray  - degenerative   Currently in Pain? Yes   Pain Score 0-No pain  two days ago 4/10   Pain Location Back   Pain Orientation Left   Aggravating Factors  bending    Pain Relieving Factors heat through out the day, propping feet.             [facility] PT Assessment - 11/22/15 0001      Assessment   Medical Diagnosis Lt lumbar radiculopathy   Referring Provider Dr Benjamin Stain   Onset Date/Surgical Date 10/11/15   Hand Dominance Right   Next MD  Visit 12/06/15   Prior Therapy not for low back     Precautions   Precautions None   Precaution Comments cleared from all precautions with neck surgery     Balance Screen   Has the patient fallen in the past 6 months No   Has the patient had a decrease in activity level because of a fear of falling?  No   Is the patient reluctant to leave their home because of a fear of falling?  No     Prior Function   Level of Independence Independent   Vocation Full time employment   Vocation Requirements in the court house    Leisure watching football games     Observation/Other Assessments   Focus on Therapeutic Outcomes (FOTO)  47% limited     Functional Tests   Functional tests Squat;Single leg stance     Squat   Comments WNL     Single Leg Stance   Comments bilat WNL     Posture/Postural Control   Posture/Postural Control Postural limitations   Postural Limitations Decreased thoracic kyphosis;Rounded Shoulders  elevated Lt shoulder complex     ROM / Strength   AROM / PROM / Strength AROM;Strength     AROM  AROM Assessment Site Lumbar   Lumbar Flexion 2" from floor   Lumbar Extension deceased 75%, no pain   Lumbar - Right Side Bend decreased 25%    Lumbar - Left Side Bend decreased 25%    Lumbar - Right Rotation WNL   Lumbar - Left Rotation decreased 25%     Strength   Strength Assessment Site Hip;Knee;Ankle;Lumbar   Right/Left Hip --  bilat WNL   Right/Left Knee --  bilat WNL   Right/Left Ankle --  bilat WNL   Lumbar Flexion --  TA fair    Lumbar Extension --  multifidi Rt good, Lt fair and delayed contraction      Flexibility   Soft Tissue Assessment /Muscle Length yes   Hamstrings supine SLR Lt 60, Rt 65   Quadriceps prone knee flex Lt 124, Rt 130   ITB tight Rt      Palpation   Spinal mobility lumbar WNL CPA & bilat UPA mobs   Palpation comment slight tightness in bilat lumbar paraspinals however not painful.      Special Tests    Special Tests --   (-) slump and SLR                    [facility] Adult PT Treatment/Exercise - 11/22/15 0001      Exercises   Exercises Lumbar     Lumbar Exercises: Stretches   Passive Hamstring Stretch 1 rep  45 sec with strap, supine   Quad Stretch 1 rep  45 sec prone with strap   ITB Stretch 1 rep  45 sec, cross body with strap     Lumbar Exercises: Supine   Ab Set 5 reps;5 seconds   Clam 10 reps     Lumbar Exercises: Prone   Other Prone Lumbar Exercises pelvic press, then with hip ext                PT Education - 11/22/15 1453    Education provided Yes   Education Details HEP    Person(s) Educated Patient   Methods Explanation;Demonstration;Handout   Comprehension Returned demonstration;Verbalized understanding             PT Long Term Goals - 11/22/15 1529      PT LONG TERM GOAL #1   Title I with advanced HEP for LE flexibility and deep core work ( 12/20/15)    Time 4   Period Weeks   Status New     PT LONG TERM GOAL #2   Title demo bilat hamstring flexibility =/> 80 degrees ( 12/20/15)    Time 4   Period Weeks   Status New     PT LONG TERM GOAL #3   Title improve FOTO =/> 28% limited  (12/20/15)    Time 4   Period Weeks   Status New     PT LONG TERM GOAL #4   Title report no return of LBP or Lt LE pain ( 12/20/15)    Time 4   Period Weeks   Status New               Plan - 11/22/15 1525    Clinical Impression Statement 50 yo female with h/o LBP with Lt LE radicular symptoms.  Her symptoms resolved two days ago however she presents with significant tightness in hip musculature and weakness in her deep core muslces. She is currently on prednisone for another issue and this may also help her back.  Rehab Potential Excellent   PT Frequency 1x / week   PT Duration 4 weeks   PT Treatment/Interventions Moist Heat;Ultrasound;Therapeutic exercise;Dry needling;Taping;Manual techniques;Patient/family education;Electrical  Stimulation;Cryotherapy   PT Next Visit Plan Optometrist education and progress core.  If still no back/leg pain discharge to HEP    Consulted and Agree with Plan of Care Patient      Patient will benefit from skilled therapeutic intervention in order to improve the following deficits and impairments:  Decreased strength, Impaired flexibility  Visit Diagnosis: Muscle weakness (generalized) - Plan: PT plan of care cert/re-cert  Stiffness of left hip, not elsewhere classified - Plan: PT plan of care cert/re-cert  Stiffness of right hip, not elsewhere classified - Plan: PT plan of care cert/re-cert     Problem List Patient Active Problem List   Diagnosis Date Noted  . Felon of finger 11/21/2015  . Thumb pain 11/21/2015  . Left lumbar radiculopathy 11/09/2015  . Overweight 05/07/2015  . Cervical radiculopathy at C7 04/16/2015  . Overweight (BMI 25.0-29.9) 10/23/2014  . Intertrigo 06/01/2014  . Herpes simplex 05/14/2014  . Abnormal weight gain 04/24/2014    Roderic Scarce PT  11/22/2015, 3:35 PM  [facility] [address] [place], [place], [address] Phone: [phone]   Fax:  [phone]  Name: Amatullah Christy MRN: [id number] Date of Birth: 08-Dec-1965

## 2015-11-27 ENCOUNTER — Encounter: Payer: Self-pay | Admitting: Physician Assistant

## 2015-11-27 ENCOUNTER — Ambulatory Visit (INDEPENDENT_AMBULATORY_CARE_PROVIDER_SITE_OTHER): Payer: BC Managed Care – PPO | Admitting: Physician Assistant

## 2015-11-27 VITALS — BP 127/76 | HR 89 | Wt 193.0 lb

## 2015-11-27 DIAGNOSIS — R635 Abnormal weight gain: Secondary | ICD-10-CM

## 2015-11-27 MED ORDER — PHENTERMINE HCL 37.5 MG PO TABS: 37.5000 mg | ORAL_TABLET | Freq: Every day | ORAL | 0 refills | Status: DC

## 2015-11-27 NOTE — Progress Notes (Signed)
Renee Hopkins presents to the clinic for a weight and BP check. Denies chest pain, SOB, palpitation, and trouble sleeping.  Pt would like to restart phentermine.  She was previously on the medication but had to stop it due to surgery back in June. A new Rx will be faxed to [facility] later today. Pt advised to make next appointment in 4 weeks. -[facility]/RMA

## 2015-11-29 ENCOUNTER — Ambulatory Visit (INDEPENDENT_AMBULATORY_CARE_PROVIDER_SITE_OTHER): Payer: BC Managed Care – PPO | Admitting: Physical Therapy

## 2015-11-29 DIAGNOSIS — M25652 Stiffness of left hip, not elsewhere classified: Secondary | ICD-10-CM

## 2015-11-29 DIAGNOSIS — M25651 Stiffness of right hip, not elsewhere classified: Secondary | ICD-10-CM

## 2015-11-29 DIAGNOSIS — M6281 Muscle weakness (generalized): Secondary | ICD-10-CM

## 2015-11-29 NOTE — Patient Instructions (Signed)
Hamstring Step 1    Straighten left knee. Keep knee level with other knee or on bolster. Hold _30__ seconds. Relax knee by returning foot to start. Repeat _2-3__ times. (repeat other leg)  KNEE: Quadriceps - Prone    Place strap around ankle. Bring ankle toward buttocks. Press hip into surface. Hold _30__ seconds. _2__ reps per set, _2__ sets per day,  Pelvic Press     Place hands under belly between navel and pubic bone, palms up. Feel pressure on hands. Increase pressure on hands by pressing pelvis down. This is NOT a pelvic tilt. Hold __5_ seconds. Relax. Repeat _10__ times. Once a day.  KNEE: Flexion - Prone   Hold pelvic press. Bend knee, then return the foot down. Repeat on opposite leg. Do not raise hips. _10__ reps per set. When this is mastered, pull both heels up at same time, x 10 reps.  Once a day   Leg Lift: One-Leg   Press pelvis down. Keep knee straight; lengthen and lift one leg (from waist). Do not twist body. Keep other leg down. Hold _1__ seconds. Relax. Repeat 10 time. Repeat with other leg.   Axial Extension- Upper body sequence * always start with pelvic press    Lie on stomach with forehead resting on floor and arms at sides. Tuck chin in and raise head from floor without bending it up or down. Repeat ___10_ times per set. Do __1__ sets per session. Do _1___ sessions per day.  3 part core exercise    With neutral spine, tighten pelvic floor, then tighten abdominal muscles sucking your belly button to back bone, then tighten muscles in low back at waist. Hold 10 seconds. Repeat _10_ times. Do _several__ times a day.  * When you have mastered this, you can contract all 3 muscle groups at same time.   * Progress to do this activity sitting, standing, walking and functional activities.      Sleeping on Back  Place pillow under knees. A pillow with cervical support and a roll around waist are also helpful. Copyright  VHI. All rights  reserved.  Sleeping on Side Place pillow between knees. Use cervical support under neck and a roll around waist as needed. Copyright  VHI. All rights reserved.   Sleeping on Stomach   If this is the only desirable sleeping position, place pillow under lower legs, and under stomach or chest as needed.  Posture - Sitting   Sit upright, head facing forward. Try using a roll to support lower back. Keep shoulders relaxed, and avoid rounded back. Keep hips level with knees. Avoid crossing legs for long periods. Stand to Sit / Sit to Stand   To sit: Bend knees to lower self onto front edge of chair, then scoot back on seat. To stand: Reverse sequence by placing one foot forward, and scoot to front of seat. Use rocking motion to stand up.   Work Height and Reach  Ideal work height is no more than 2 to 4 inches below elbow level when standing, and at elbow level when sitting. Reaching should be limited to arm's length, with elbows slightly bent.  Bending  Bend at hips and knees, not back. Keep feet shoulder-width apart.    Posture - Standing   Good posture is important. Avoid slouching and forward head thrust. Maintain curve in low back and align ears over shoul- ders, hips over ankles.  Alternating Positions   Alternate tasks and change positions frequently to reduce fatigue  and muscle tension. Take rest breaks. Computer Work   Position work to Art gallery manager. Use proper work and seat height. Keep shoulders back and down, wrists straight, and elbows at right angles. Use chair that provides full back support. Add footrest and lumbar roll as needed.  Getting Into / Out of Car  Lower self onto seat, scoot back, then bring in one leg at a time. Reverse sequence to get out.  Dressing  Lie on back to pull socks or slacks over feet, or sit and bend leg while keeping back straight.    Housework - Sink  Place one foot on ledge of cabinet under sink when standing at sink for  prolonged periods.   Pushing / Pulling  Pushing is preferable to pulling. Keep back in proper alignment, and use leg muscles to do the work.  Deep Squat   Squat and lift with both arms held against upper trunk. Tighten stomach muscles without holding breath. Use smooth movements to avoid jerking.  Avoid Twisting   Avoid twisting or bending back. Pivot around using foot movements, and bend at knees if needed when reaching for articles.  Carrying Luggage   Distribute weight evenly on both sides. Use a cart whenever possible. Do not twist trunk. Move body as a unit.   Lifting Principles .Maintain proper posture and head alignment. .Slide object as close as possible before lifting. .Move obstacles out of the way. .Test before lifting; ask for help if too heavy. .Tighten stomach muscles without holding breath. .Use smooth movements; do not jerk. .Use legs to do the work, and pivot with feet. .Distribute the work load symmetrically and close to the center of trunk. .Push instead of pull whenever possible.   Ask For Help   Ask for help and delegate to others when possible. Coordinate your movements when lifting together, and maintain the low back curve.  Log Roll   Lying on back, bend left knee and place left arm across chest. Roll all in one movement to the right. Reverse to roll to the left. Always move as one unit. Housework - Sweeping  Use long-handled equipment to avoid stooping.   Housework - Wiping  Position yourself as close as possible to reach work surface. Avoid straining your back.  Laundry - Unloading Wash   To unload small items at bottom of washer, lift leg opposite to arm being used to reach.  Gardening - Raking  Move close to area to be raked. Use arm movements to do the work. Keep back straight and avoid twisting.     Cart  When reaching into cart with one arm, lift opposite leg to keep back straight.   Getting Into / Out of Bed  Lower  self to lie down on one side by raising legs and lowering head at the same time. Use arms to assist moving without twisting. Bend both knees to roll onto back if desired. To sit up, start from lying on side, and use same move-ments in reverse. Housework - Vacuuming  Hold the vacuum with arm held at side. Step back and forth to move it, keeping head up. Avoid twisting.   Laundry - Armed forces training and education officer so that bending and twisting can be avoided.   Laundry - Unloading Dryer  Squat down to reach into clothes dryer or use a reacher.  Gardening - Weeding / Psychiatric nurse or Kneel. Knee pads may be helpful.

## 2015-11-29 NOTE — Therapy (Signed)
[place], [place], [address] Phone: [phone]   Fax:  [phone]  Physical Therapy Treatment  Patient Details  Name: Renee Hopkins MRN: [id number] Date of Birth: [birth year] Referring Provider: Dr Dianah Field  Encounter Date: 11/29/2015      PT End of Session - 11/29/15 1456    Visit Number 2   Number of Visits 4   Date for PT Re-Evaluation 12/20/15   PT Start Time 3299   PT Stop Time 1543   PT Time Calculation (min) 51 min   Activity Tolerance Patient tolerated treatment well;No increased pain      Past Medical History:  Diagnosis Date  . Arthritis    back    Past Surgical History:  Procedure Laterality Date  . CERVICAL DISC ARTHROPLASTY N/A 08/08/2015   Procedure: Cervical five-six Cervical six-seven  Anterior cervical Arthroplasty;  Surgeon: Ashok Pall, MD;  Location: [facility];  Service: Neurosurgery;  Laterality: N/A;  . CYSTECTOMY Right    shoulder  . SHOULDER SURGERY      There were no vitals filed for this visit.      Subjective Assessment - 11/29/15 1458    Subjective Pt finished prednisone 3 days ago.  She is still taking Meloxicam 1x/day.  She's not sure if the Meloxicam is making a difference; is open to weaning off of medication.   She is not taking heat pad to work anymore.    Currently in Pain? Yes   Pain Score 2    Pain Location Back   Pain Orientation Left   Pain Descriptors / Indicators Dull   Aggravating Factors  bending, twisting     Pain Relieving Factors heating pad.              [facility] PT Assessment - 11/29/15 0001      Flexibility   Hamstrings Lt 55 deg (with strap), Rt 70 deg           [facility] Adult PT Treatment/Exercise - 11/29/15 0001      Lumbar Exercises: Stretches   Passive Hamstring Stretch 30 seconds;3 reps  supine with strap    Quad Stretch 2 reps;30 seconds   ITB Stretch 2 reps;60 seconds  supine with strap     Lumbar  Exercises: Aerobic   Stationary Bike NuStep L4: 6.5 min      Lumbar Exercises: Supine   Ab Set 5 reps;5 seconds     Lumbar Exercises: Prone   Other Prone Lumbar Exercises pelvic press x 5 sec hold x 5 reps.   Press with hip ext x 5 reps each side, then knee flex/ext x 5 reps each leg.    Other Prone Lumbar Exercises axial ext x 2 sec hold x 5 reps      Lumbar Exercises: Quadruped   Madcat/Old Horse 5 reps     Modalities   Modalities Moist Heat     Moist Heat Therapy   Number Minutes Moist Heat 12 Minutes   Moist Heat Location Lumbar Spine                     PT Long Term Goals - 11/29/15 1541      PT LONG TERM GOAL #1   Title I with advanced HEP for LE flexibility and deep core work ( 12/20/15)    Time 4   Period Weeks   Status On-going     PT LONG TERM GOAL #2   Title  demo bilat hamstring flexibility =/> 80 degrees ( 12/20/15)    Time 4   Period Weeks   Status On-going     PT LONG TERM GOAL #3   Title improve FOTO =/> 28% limited  (12/20/15)    Time 4   Period Weeks   Status On-going     PT LONG TERM GOAL #4   Title report no return of LBP or Lt LE pain ( 12/20/15)    Time 4   Period Weeks   Status On-going     PT LONG TERM GOAL #5   Title Improve FOTO to </= 34% limitation 05/25/15   Time 6   Period Weeks   Status On-going               Plan - 11/29/15 1456    Clinical Impression Statement Pt required VC and tactile cues for improved form of HEP issued last wk. She continues with tightness in bilat hamstrings, Lt tighter than Rt.  Pt issued information on body mechanics.  No goals met yet; only 2nd visit.     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  If still no back/leg  pain discharge to HEP    Consulted and Agree with Plan of Care Patient      Patient will benefit from skilled therapeutic intervention in order to improve the following deficits and impairments:  Decreased strength, Impaired flexibility  Visit Diagnosis: Muscle weakness (generalized)  Stiffness of left hip, not elsewhere classified  Stiffness of right hip, not elsewhere classified     Problem List Patient Active Problem List   Diagnosis Date Noted  . Felon of finger 11/21/2015  . Thumb pain 11/21/2015  . Left lumbar radiculopathy 11/09/2015  . Overweight 05/07/2015  . Cervical radiculopathy at C7 04/16/2015  . Overweight (BMI 25.0-29.9) 10/23/2014  . Intertrigo 06/01/2014  . Herpes simplex 05/14/2014  . Abnormal weight gain 04/24/2014   Kerin Perna, PTA 11/29/15 3:43 PM   [place], [place], [address] Phone: [phone]   Fax:  [phone]  Name: Tegan Britain MRN: [id number] Date of Birth: 1965-11-12

## 2015-11-30 ENCOUNTER — Ambulatory Visit: Payer: BC Managed Care – PPO | Admitting: Sports Medicine

## 2015-12-05 ENCOUNTER — Ambulatory Visit (INDEPENDENT_AMBULATORY_CARE_PROVIDER_SITE_OTHER): Payer: BC Managed Care – PPO | Admitting: Physical Therapy

## 2015-12-05 DIAGNOSIS — M25652 Stiffness of left hip, not elsewhere classified: Secondary | ICD-10-CM

## 2015-12-05 DIAGNOSIS — M6281 Muscle weakness (generalized): Secondary | ICD-10-CM | POA: Diagnosis not present

## 2015-12-05 DIAGNOSIS — M25651 Stiffness of right hip, not elsewhere classified: Secondary | ICD-10-CM

## 2015-12-05 NOTE — Therapy (Addendum)
[place], [place], [address] Phone: [phone]   Fax:  [phone]  Physical Therapy Treatment  Patient Details  Name: Renee Hopkins MRN: [id number] Date of Birth: 1965/06/20 Referring Provider: Dr Dianah Field  Encounter Date: 12/05/2015      PT End of Session - 12/05/15 1524    Visit Number 3   Number of Visits 4   Date for PT Re-Evaluation 12/20/15   PT Start Time 1520   PT Stop Time 1558   PT Time Calculation (min) 38 min      Past Medical History:  Diagnosis Date  . Arthritis    back    Past Surgical History:  Procedure Laterality Date  . CERVICAL DISC ARTHROPLASTY N/A 08/08/2015   Procedure: Cervical five-six Cervical six-seven  Anterior cervical Arthroplasty;  Surgeon: Ashok Pall, MD;  Location: [facility];  Service: Neurosurgery;  Laterality: N/A;  . CYSTECTOMY Right    shoulder  . SHOULDER SURGERY      There were no vitals filed for this visit.      Subjective Assessment - 12/05/15 1524    Subjective Pt reports she is not having any pain anymore, "I want to knock on wood that it doesn't come back".  She hasn't taken any Meloxican for over a week now.  She is able to shave legs without pain. She would like to get back to her walking program.    Currently in Pain? No/denies   Pain Score 0-No pain            [facility] PT Assessment - 12/05/15 0001      Flexibility   Hamstrings Lt 76 deg (with strap), Rt 80 deg    Quadriceps Lt knee 125 deg, Rt knee 122 deg           [facility] Adult PT Treatment/Exercise - 12/05/15 0001      Lumbar Exercises: Stretches   Passive Hamstring Stretch 30 seconds;3 reps  supine with strap    Double Knee to Chest Stretch 1 rep;20 seconds   Prone on Elbows Stretch 1 rep;20 seconds   Quad Stretch 2 reps;30 seconds   ITB Stretch 2 reps;60 seconds  supine with strap   Piriformis Stretch 2 reps;20 seconds  each side, seated     Lumbar  Exercises: Aerobic   Stationary Bike NuStep L4: 6.5 min      Lumbar Exercises: Supine   Ab Set --   Bent Knee Raise --     Lumbar Exercises: Prone   Other Prone Lumbar Exercises pelvic press x 5 sec hold x 5 reps.   Press with hip ext x 8 reps each side, then bilat knee flex/ext x 8 reps each leg.    Other Prone Lumbar Exercises axial ext x 2 sec hold x 10 reps,  opp arm and leg lift x 5 reps each side.       Lumbar Exercises: Quadruped   Madcat/Old Horse 5 reps     Modalities   Modalities --  pt declined.            PT Long Term Goals - 12/05/15 1534      PT LONG TERM GOAL #1   Title I with advanced HEP for LE flexibility and deep core work ( 12/20/15)    Time 4   Period Weeks   Status Achieved     PT LONG TERM GOAL #2   Title demo bilat hamstring flexibility =/> 80 degrees (  12/20/15)    Period Weeks   Status Partially Met     PT LONG TERM GOAL #3   Title improve FOTO =/> 28% limited  (12/20/15)    Time 4   Period Weeks   Status --  not captured during visit #3     PT LONG TERM GOAL #4   Title report no return of LBP or Lt LE pain ( 12/20/15)    Time 4   Period Weeks   Status Achieved               Plan - 12/05/15 1609    Clinical Impression Statement Pt demonstrated improved hamstring flexibility this visit.  She was able to sustain good contraction of multifidi with min cues.  She tolerated all exercises without any production of back pain.  Pt has partially met her goals and requests to hold therapy while she continues on her own with HEP.    Rehab Potential Excellent   PT Frequency 1x / week   PT Duration 4 weeks   PT Treatment/Interventions Moist Heat;Ultrasound;Therapeutic exercise;Dry needling;Taping;Manual techniques;Patient/family education;Electrical Stimulation;Cryotherapy   PT Next Visit Plan Hold therapy until end of POC. If pt doesn't return, will d/c to HEP.       Patient will benefit from skilled therapeutic intervention in order  to improve the following deficits and impairments:  Decreased strength, Impaired flexibility  Visit Diagnosis: Muscle weakness (generalized)  Stiffness of left hip, not elsewhere classified  Stiffness of right hip, not elsewhere classified     Problem List Patient Active Problem List   Diagnosis Date Noted  . Felon of finger 11/21/2015  . Thumb pain 11/21/2015  . Left lumbar radiculopathy 11/09/2015  . Overweight 05/07/2015  . Cervical radiculopathy at C7 04/16/2015  . Overweight (BMI 25.0-29.9) 10/23/2014  . Intertrigo 06/01/2014  . Herpes simplex 05/14/2014  . Abnormal weight gain 04/24/2014   Kerin Perna, PTA 12/05/15 4:16 PM  [facility] [place], [place], [address] Phone: [phone]   Fax:  [phone]  Name: Renee Hopkins MRN: [id number] Date of Birth: [birth year]  PHYSICAL THERAPY DISCHARGE SUMMARY  Visits from Start of Care: 3  Current functional level related to goals / functional outcomes: See above   Remaining deficits: See above   Education / Equipment: HEP Plan: Patient agrees to discharge.  Patient goals were partially met. Patient is being discharged due to being pleased with the current functional level.  ?????    Jeral Pinch, PT 01/21/16 3:38 PM

## 2015-12-05 NOTE — Patient Instructions (Signed)
Knee to Chest: Transverse Plane Stability Arm / Leg Lift: Opposite (Prone)    Lift right leg and opposite arm __2-3__ inches from floor, keeping knee locked. Repeat __5__ times per set. Do _2___ sets per session. Do __3__ sessions per week. Progress to 2 sets of 10 repetitions when stronger.   Piriformis Stretch    Lying on back, pull right knee toward opposite shoulder. Hold __20__ seconds. Repeat __2__ times. Do __2__ sessions per day.   * can stretch hip in sitting, too.  Cross leg and bring knee towards opposite shoulder.   [facility] [place] [address] [place], [place] [address]  [phone] (office) [phone] (fax)

## 2015-12-06 ENCOUNTER — Encounter: Payer: Self-pay | Admitting: Sports Medicine

## 2015-12-06 ENCOUNTER — Ambulatory Visit (INDEPENDENT_AMBULATORY_CARE_PROVIDER_SITE_OTHER): Payer: BC Managed Care – PPO | Admitting: Sports Medicine

## 2015-12-06 DIAGNOSIS — M5416 Radiculopathy, lumbar region: Secondary | ICD-10-CM

## 2015-12-06 NOTE — Progress Notes (Signed)
  Subjective:    CC: Follow-up  HPI: Left S1 radiculopathy: Resolved with physical therapy, steroids, meloxicam.  Felon: Resolved.  Past medical history:  Negative.  See flowsheet/record as well for more information.  Surgical history: Negative.  See flowsheet/record as well for more information.  Family history: Negative.  See flowsheet/record as well for more information.  Social history: Negative.  See flowsheet/record as well for more information.  Allergies, and medications have been entered into the medical record, reviewed, and no changes needed.   Review of Systems: No fevers, chills, night sweats, weight loss, chest pain, or shortness of breath.   Objective:    General: Well Developed, well nourished, and in no acute distress.  Neuro: Alert and oriented x3, extra-ocular muscles intact, sensation grossly intact.  HEENT: Normocephalic, atraumatic, pupils equal round reactive to light, neck supple, no masses, no lymphadenopathy, thyroid nonpalpable.  Skin: Warm and dry, no rashes. Cardiac: Regular rate and rhythm, no murmurs rubs or gallops, no lower extremity edema.  Respiratory: Clear to auscultation bilaterally. Not using accessory muscles, speaking in full sentences.  Impression and Recommendations:    Left lumbar radiculopathy  Resolved with physical therapy, steroids, meloxicam, return as needed.

## 2015-12-06 NOTE — Assessment & Plan Note (Signed)
Resolved with physical therapy, steroids, meloxicam, return as needed.

## 2015-12-11 ENCOUNTER — Encounter: Payer: Self-pay | Admitting: Family Medicine

## 2015-12-11 ENCOUNTER — Ambulatory Visit (INDEPENDENT_AMBULATORY_CARE_PROVIDER_SITE_OTHER): Payer: BC Managed Care – PPO | Admitting: Family Medicine

## 2015-12-11 VITALS — BP 136/75 | HR 105 | Wt 192.0 lb

## 2015-12-11 DIAGNOSIS — N760 Acute vaginitis: Secondary | ICD-10-CM | POA: Diagnosis not present

## 2015-12-11 MED ORDER — FLUCONAZOLE 150 MG PO TABS: 150.0000 mg | ORAL_TABLET | Freq: Once | ORAL | 0 refills | Status: AC

## 2015-12-11 NOTE — Progress Notes (Signed)
Subjective:    CC: ? yeast infection  HPI: 2 weeks of yellow/green vaginal discharge.  No pain with intercourse.  + itching and discomfort. No flank pain. No tried anything OTC.  No pain with urination.  She was treated for BV about 5-6 weeks ago.  She was also on doxycycline for a finger infection about 3 weeks ago.  No urinary sxs.    Past medical history, Surgical history, Family history not pertinant except as noted below, Social history, Allergies, and medications have been entered into the medical record, reviewed, and corrections made.   Review of Systems: No fevers, chills, night sweats, weight loss, chest pain, or shortness of breath.   Objective:    General: Well Developed, well nourished, and in no acute distress.  Neuro: Alert and oriented x3, extra-ocular muscles intact, sensation grossly intact.  HEENT: Normocephalic, atraumatic     Impression and Recommendations:   Vaginitis - self wet prep performed. Will call with results when available.

## 2015-12-12 LAB — WET PREP, GENITAL: TRICH WET PREP: NONE SEEN

## 2015-12-12 MED ORDER — METRONIDAZOLE 500 MG PO TABS: 500.0000 mg | ORAL_TABLET | Freq: Two times a day (BID) | ORAL | 0 refills | Status: DC

## 2015-12-12 MED ORDER — FLUCONAZOLE 150 MG PO TABS: 150.0000 mg | ORAL_TABLET | Freq: Every day | ORAL | 0 refills | Status: DC

## 2015-12-12 NOTE — Addendum Note (Signed)
Addended by: Nani GasserMETHENEY, Randalyn Ahmed D on: 12/12/2015 08:21 AM   Modules accepted: Orders

## 2015-12-25 ENCOUNTER — Ambulatory Visit (INDEPENDENT_AMBULATORY_CARE_PROVIDER_SITE_OTHER): Payer: BC Managed Care – PPO | Admitting: Physician Assistant

## 2015-12-25 VITALS — BP 123/62 | HR 95 | Resp 16 | Wt 191.0 lb

## 2015-12-25 DIAGNOSIS — R635 Abnormal weight gain: Secondary | ICD-10-CM

## 2015-12-25 MED ORDER — PHENTERMINE HCL 37.5 MG PO TABS: 37.5000 mg | ORAL_TABLET | Freq: Every day | ORAL | 0 refills | Status: DC

## 2015-12-25 NOTE — Progress Notes (Signed)
   Subjective:    Patient ID: Marvia PicklesMelanie Gaus, female    DOB: 05/26/1965, 50 y.o.   MRN: [id number]  HPI  Marvia PicklesMelanie Fiorenza is here for blood pressure and weight check. Exercise is not going well due to her back problem. She thinks this will get better. Denies trouble sleeping or palpitations.     Review of Systems     Objective:   Physical Exam        Assessment & Plan:  Abnormal weight gain - Patient has lost weight. A refill of phentermine will be faxed to the pharmacy.

## 2016-01-24 ENCOUNTER — Ambulatory Visit (INDEPENDENT_AMBULATORY_CARE_PROVIDER_SITE_OTHER): Payer: BC Managed Care – PPO | Admitting: Family Medicine

## 2016-01-24 VITALS — BP 130/81 | HR 104 | Ht 70.0 in | Wt 191.0 lb

## 2016-01-24 DIAGNOSIS — R635 Abnormal weight gain: Secondary | ICD-10-CM

## 2016-01-24 NOTE — Progress Notes (Signed)
Pt in today for weight/bp check.  She has not lost any weight this time.  Advised pt to schedule a f/u with pcp.  Pt is okay with not refilling phentermine today.

## 2016-01-24 NOTE — Progress Notes (Signed)
   Subjective:    Patient ID: Renee Hopkins, female    DOB: 06/05/1965, 50 y.o.   MRN: [id number]  HPI    Review of Systems     Objective:   Physical Exam        Assessment & Plan:  Abnormal weight gain - has not lost weight. Will follow up to discuss alternative.    Nani Gasseratherine Estuardo Frisbee, MD

## 2016-02-19 ENCOUNTER — Ambulatory Visit (INDEPENDENT_AMBULATORY_CARE_PROVIDER_SITE_OTHER): Payer: BC Managed Care – PPO | Admitting: Physician Assistant

## 2016-02-19 ENCOUNTER — Encounter: Payer: Self-pay | Admitting: Physician Assistant

## 2016-02-19 VITALS — BP 112/78 | HR 105 | Ht 70.0 in | Wt 192.0 lb

## 2016-02-19 DIAGNOSIS — E663 Overweight: Secondary | ICD-10-CM

## 2016-02-19 DIAGNOSIS — R638 Other symptoms and signs concerning food and fluid intake: Secondary | ICD-10-CM | POA: Diagnosis not present

## 2016-02-19 MED ORDER — BUPROPION HCL ER (XL) 150 MG PO TB24: 150.0000 mg | ORAL_TABLET | Freq: Every day | ORAL | 1 refills | Status: DC

## 2016-02-19 NOTE — Progress Notes (Signed)
   Subjective:    Patient ID: Renee Hopkins, female    DOB: 01/30/1966, 50 y.o.   MRN: [id number]  HPI  Pt is a 50 yo female who presents to the clinic to discuss weight. She had been on phentermine and plateau. She admits she has not been exercising. She is trying to make good diet choices. She finds herself craving foods at night when she gets home from work. She has noticed weight creeping back up.     Review of Systems  All other systems reviewed and are negative.      Objective:   Physical Exam  Constitutional: She is oriented to person, place, and time. She appears well-developed and well-nourished.  HENT:  Head: Normocephalic and atraumatic.  Cardiovascular: Normal rate, regular rhythm and normal heart sounds.   Pulmonary/Chest: Effort normal and breath sounds normal. She has no wheezes.  Neurological: She is alert and oriented to person, place, and time.  Skin: Skin is warm and dry.  Psychiatric: She has a normal mood and affect. Her behavior is normal.          Assessment & Plan:  [name].[name].Diagnoses and all orders for this visit:  Overweight (BMI 25.0-29.9) -     buPROPion (WELLBUTRIN XL) 150 MG 24 hr tablet; Take 1 tablet (150 mg total) by mouth daily.  Abnormal craving -     buPROPion (WELLBUTRIN XL) 150 MG 24 hr tablet; Take 1 tablet (150 mg total) by mouth daily.   BMI is 27 not a candidate for weight loss medications. Due to pts cravings I think wellbutrin could help with stress eatings and hopefully help her stay focused with diet and exercise.

## 2016-04-07 ENCOUNTER — Other Ambulatory Visit: Payer: Self-pay | Admitting: Physician Assistant

## 2016-04-07 ENCOUNTER — Encounter: Payer: Self-pay | Admitting: Physician Assistant

## 2016-04-07 MED ORDER — VALACYCLOVIR HCL 500 MG PO TABS: 500.0000 mg | ORAL_TABLET | Freq: Two times a day (BID) | ORAL | 1 refills | Status: DC

## 2016-04-21 ENCOUNTER — Ambulatory Visit (INDEPENDENT_AMBULATORY_CARE_PROVIDER_SITE_OTHER): Payer: BC Managed Care – PPO | Admitting: Physician Assistant

## 2016-04-21 ENCOUNTER — Encounter: Payer: Self-pay | Admitting: Physician Assistant

## 2016-04-21 VITALS — BP 127/68 | HR 84 | Ht 70.0 in | Wt 192.0 lb

## 2016-04-21 DIAGNOSIS — E663 Overweight: Secondary | ICD-10-CM

## 2016-04-21 DIAGNOSIS — R638 Other symptoms and signs concerning food and fluid intake: Secondary | ICD-10-CM | POA: Diagnosis not present

## 2016-04-21 MED ORDER — BUPROPION HCL ER (XL) 300 MG PO TB24: 300.0000 mg | ORAL_TABLET | Freq: Every day | ORAL | 3 refills | Status: DC

## 2016-04-21 MED ORDER — PHENTERMINE HCL 37.5 MG PO TABS: 37.5000 mg | ORAL_TABLET | Freq: Every day | ORAL | 0 refills | Status: DC

## 2016-04-21 NOTE — Progress Notes (Signed)
   Subjective:    Patient ID: Renee Hopkins, female    DOB: 09/25/1965, 51 y.o.   MRN: [id number]  HPI Pt is a 51 yo woman who presents to the clinic to discuss weight. She is in the overweight BMI group. She is on wellbutrin and weight has stayed stable. She does feel like it has increased her motivation and mood is better. She was given phentermine about 1 year ago and wonders if she could have it again. She finds that she does well most of the day but at night she craves snacks. She is exercises 3-4 days a week walking.   Review of Systems  All other systems reviewed and are negative.      Objective:   Physical Exam  Constitutional: She is oriented to person, place, and time. She appears well-developed and well-nourished.  HENT:  Head: Normocephalic and atraumatic.  Cardiovascular: Normal rate, regular rhythm and normal heart sounds.   Pulmonary/Chest: Effort normal and breath sounds normal.  Neurological: She is alert and oriented to person, place, and time.  Psychiatric: She has a normal mood and affect. Her behavior is normal.          Assessment & Plan:  [name].[name].Renee Hopkins was seen today for abnormal weight gain.  Diagnoses and all orders for this visit:  Overweight (BMI 25.0-29.9) -     buPROPion (WELLBUTRIN XL) 300 MG 24 hr tablet; Take 1 tablet (300 mg total) by mouth daily. -     phentermine (ADIPEX-P) 37.5 MG tablet; Take 1 tablet (37.5 mg total) by mouth daily before breakfast.  Craving for particular food -     buPROPion (WELLBUTRIN XL) 300 MG 24 hr tablet; Take 1 tablet (300 mg total) by mouth daily. -     phentermine (ADIPEX-P) 37.5 MG tablet; Take 1 tablet (37.5 mg total) by mouth daily before breakfast.   Increased wellbutrin. Added phentermine. Pt aware of side effects. Only giving for one month to jump start weight loss. Continue exercise. Make sure eating enough at the beginning of day to avoid cravings at end of day.  Follow up in 3 months.

## 2016-05-11 ENCOUNTER — Other Ambulatory Visit: Payer: Self-pay | Admitting: Physician Assistant

## 2016-05-11 DIAGNOSIS — R638 Other symptoms and signs concerning food and fluid intake: Secondary | ICD-10-CM

## 2016-05-11 DIAGNOSIS — E663 Overweight: Secondary | ICD-10-CM

## 2016-07-25 ENCOUNTER — Encounter: Payer: Self-pay | Admitting: Physician Assistant

## 2016-07-25 ENCOUNTER — Ambulatory Visit (INDEPENDENT_AMBULATORY_CARE_PROVIDER_SITE_OTHER): Payer: BC Managed Care – PPO | Admitting: Physician Assistant

## 2016-07-25 VITALS — BP 130/71 | HR 94 | Ht 70.0 in | Wt 186.0 lb

## 2016-07-25 DIAGNOSIS — E663 Overweight: Secondary | ICD-10-CM

## 2016-07-25 DIAGNOSIS — L918 Other hypertrophic disorders of the skin: Secondary | ICD-10-CM

## 2016-07-25 DIAGNOSIS — Z131 Encounter for screening for diabetes mellitus: Secondary | ICD-10-CM | POA: Diagnosis not present

## 2016-07-25 DIAGNOSIS — Z13220 Encounter for screening for lipoid disorders: Secondary | ICD-10-CM | POA: Diagnosis not present

## 2016-07-25 DIAGNOSIS — R638 Other symptoms and signs concerning food and fluid intake: Secondary | ICD-10-CM | POA: Diagnosis not present

## 2016-07-25 MED ORDER — BUPROPION HCL ER (XL) 300 MG PO TB24: 300.0000 mg | ORAL_TABLET | Freq: Every day | ORAL | 1 refills | Status: DC

## 2016-07-25 MED ORDER — VALACYCLOVIR HCL 500 MG PO TABS: 500.0000 mg | ORAL_TABLET | Freq: Two times a day (BID) | ORAL | 1 refills | Status: DC

## 2016-07-25 NOTE — Progress Notes (Signed)
   Subjective:    Patient ID: Renee Hopkins, female    DOB: 01/25/1966, 51 y.o.   MRN: [id number]  HPI  Pt is a 51 yo female who presents to the clinic for 3 month follow up on wellbutrin for cravings and weight. She has lost 6lbs and BMI is 26.7. She feels like it is helping with her mood as well. She would like to lose more weight. She has no side effects. She admits she is not exercising like she should.    She also has a skin tag on her chest she would like removed. Just noticed it a few weeks ago.   .. Active Ambulatory Problems    Diagnosis Date Noted  . Abnormal weight gain 04/24/2014  . Herpes simplex 05/14/2014  . Intertrigo 06/01/2014  . Overweight (BMI 25.0-29.9) 10/23/2014  . Cervical radiculopathy at C7 04/16/2015  . Overweight 05/07/2015  . Left lumbar radiculopathy 11/09/2015  . Felon of finger 11/21/2015  . Thumb pain 11/21/2015  . Craving for particular food 02/19/2016  . Acrochordon 07/25/2016   Resolved Ambulatory Problems    Diagnosis Date Noted  . Obesity 04/24/2014  . Muscle spasm of back 04/16/2015  . Muscle strain of left upper back 04/16/2015  . 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 Arthritis        Review of Systems  All other systems reviewed and are negative.      Objective:   Physical Exam  Constitutional: She is oriented to person, place, and time. She appears well-developed and well-nourished.  HENT:  Head: Normocephalic and atraumatic.  Cardiovascular: Normal rate, regular rhythm and normal heart sounds.   Pulmonary/Chest: Effort normal and breath sounds normal. She has no wheezes.  Neurological: She is alert and oriented to person, place, and time.  Skin:  Chest one pedunculated skin tag.  Cleaned with alcohol pad and snipped with scissors. Bandage placed over snip.   Psychiatric: She has a normal mood and affect. Her behavior is normal.           Assessment & Plan:  [name].[name].Renee Hopkins was seen today for obesity.  Diagnoses and all orders for this visit:  Overweight (BMI 25.0-29.9) -     buPROPion (WELLBUTRIN XL) 300 MG 24 hr tablet; Take 1 tablet (300 mg total) by mouth daily.  Craving for particular food -     buPROPion (WELLBUTRIN XL) 300 MG 24 hr tablet; Take 1 tablet (300 mg total) by mouth daily.  Screening for diabetes mellitus -     COMPLETE METABOLIC PANEL WITH GFR  Screening for lipid disorders -     Lipid panel  Acrochordon  Other orders -     valACYclovir (VALTREX) 500 MG tablet; Take 1 tablet (500 mg total) by mouth 2 (two) times daily. For 3 days for outbreak.   Pt is doing great and continues to drop weight. Refilled for another 6months. Almost to goal which is BMI under 25.  re encouraged 150minutes of exercise a week and 1500 calorie diet.

## 2016-08-24 ENCOUNTER — Other Ambulatory Visit: Payer: Self-pay | Admitting: Physician Assistant

## 2016-08-24 DIAGNOSIS — R638 Other symptoms and signs concerning food and fluid intake: Secondary | ICD-10-CM

## 2016-08-24 DIAGNOSIS — E663 Overweight: Secondary | ICD-10-CM

## 2016-09-11 ENCOUNTER — Encounter: Payer: Self-pay | Admitting: Physician Assistant

## 2016-09-11 ENCOUNTER — Ambulatory Visit (INDEPENDENT_AMBULATORY_CARE_PROVIDER_SITE_OTHER): Payer: BC Managed Care – PPO | Admitting: Physician Assistant

## 2016-09-11 VITALS — BP 116/77 | HR 73 | Temp 98.0°F | Wt 191.0 lb

## 2016-09-11 DIAGNOSIS — Z09 Encounter for follow-up examination after completed treatment for conditions other than malignant neoplasm: Secondary | ICD-10-CM | POA: Diagnosis not present

## 2016-09-11 DIAGNOSIS — R143 Flatulence: Secondary | ICD-10-CM | POA: Diagnosis not present

## 2016-09-11 DIAGNOSIS — R1010 Upper abdominal pain, unspecified: Secondary | ICD-10-CM | POA: Diagnosis not present

## 2016-09-11 NOTE — Progress Notes (Signed)
HPI:                                                                Renee Hopkins is a 51 y.o. female who presents to [name]: Primary Care Sports Medicine today for hospital follow-up  Patient was seen at [facility] ED for syncopal episode with diarrhea. She complained of severe upper adominal pain at that time that began after eating lunch. CT was normal except for colonic diverticulosis without diverticulitis Labs were unremarkable ECG showed NSR, rate 80, biatrial enlargement, rightward axis and pulmonary disease pattern She was discharged home same-day  Reports pain resolved late last night; pain-free today. Reports normal BM today. Denies fevers, chills, hematochezia. Reports increased flatulence.   Past Medical History:  Diagnosis Date  . Arthritis    back   Past Surgical History:  Procedure Laterality Date  . CERVICAL DISC ARTHROPLASTY N/A 08/08/2015   Procedure: Cervical five-six Cervical six-seven  Anterior cervical Arthroplasty;  Surgeon: Coletta Memos, MD;  Location: [facility];  Service: Neurosurgery;  Laterality: N/A;  . CYSTECTOMY Right    shoulder  . SHOULDER SURGERY     Social History  Substance Use Topics  . Smoking status: Never Smoker  . Smokeless tobacco: Never Used  . Alcohol use 0.0 oz/week     Comment: rarely   family history includes Diabetes in her father; Hypertension in her brother and father; Stroke in her maternal grandmother.  ROS: negative except as noted in the HPI  Medications: Current Outpatient Prescriptions  Medication Sig Dispense Refill  . buPROPion (WELLBUTRIN XL) 300 MG 24 hr tablet Take 1 tablet (300 mg total) by mouth daily. 90 tablet 1  . buPROPion (WELLBUTRIN XL) 300 MG 24 hr tablet TAKE 1 TABLET (300 MG TOTAL) BY MOUTH DAILY. 30 tablet 3  . valACYclovir (VALTREX) 500 MG tablet Take 1 tablet (500 mg total) by mouth 2 (two) times daily. For 3 days for outbreak. 30 tablet 1   No current  facility-administered medications for this visit.    Allergies  Allergen Reactions  . Oxycodone-Acetaminophen Nausea And Vomiting       Objective:  BP 116/77   Pulse 73   Temp 98 F (36.7 C) (Oral)   Wt 191 lb (86.6 kg)   LMP 08/27/2016 (Approximate)   BMI 27.41 kg/m  Gen: well-groomed, cooperative, not ill-appearing, no distress HEENT: normal conjunctiva, wearing glasses, trachea midline Pulm: Normal work of breathing, normal phonation, clear to auscultation bilaterally CV: Normal rate, regular rhythm, s1 and s2 distinct, no murmurs, clicks or rubs  GI: bowel sounds active, abdomen soft, nondistended, nontender Neuro: alert and oriented x 3, normal tone, no tremor MSK: extremities atraumatic, normal gait and station Lymph: no cervical or tonsillar adenopathy Skin: warm, dry, intact; no rashes on exposed skin, no cyanosis, no jaundice   No results found for this or any previous visit (from the past 72 hour(s)). No results found. CT ABDOMEN PELVIS W IV CONTRAST 09/10/2016 Narrative:  INDICATION: abdominal pain with diarrhea and syncope. Diffuse abdominal pain. Diarrhea and syncope.   COMPARISON: CT dated 06/12/2006   TECHNIQUE: CT ABDOMEN PELVIS W IV CONTRAST - 80 mL Isovue 370 were administered intravenously. Dose reduction was utilized (automated exposure control, mA or kV adjustment  based on patient size, or iterative image reconstruction).  FINDINGS:   LOWER CHEST: - Mediastinum: Visualized portion is unremarkable.  - Heart: Visualized portion is unremarkable. No evidence of pericardial effusion. - Lung bases: Mild dependent atelectasis. - Pleura: Within normal limits.  ABDOMEN: - Liver: Low-attenuation focus adjacent to the falciform ligament likely relates to focal fatty infiltration. Subcentimeter hypodense lesion in the posterior right hepatic lobe is too small to accurately characterize. - Gallbladder/biliary system: Within normal limits. - Spleen: Splenule  noted. No acute finding. - Pancreas: Within normal limits. - Adrenal glands: Within normal limits. - Kidneys: No hydronephrosis. Possible mild scarring in the left kidney. - GI tract: No bowel obstruction. Mild colonic diverticulosis without evidence of acute diverticulitis. Fecalized distal small bowel may suggest slowed transit.  - Appendix: Unremarkable.  - Peritoneum/retroperitoneum: No free fluid. No pneumoperitoneum. No suspicious adenopathy. - Vessels: Within normal limits.  PELVIS: - Bladder: Within normal limits. - Ureters: Within normal limits. - Reproductive: Collapsed corpus luteum in the right ovary.  MUSCULOSKELETAL: - No acute osseous findings. Mild multilevel degenerative changes of the spine. Mild pubic symphysis degenerative changes. Umbilical adornment.  Impression:  IMPRESSION: 1. No acute CT findings in the abdomen or pelvis.  2. Mild colonic diverticulosis without diverticulitis.       Assessment and Plan: 51 y.o. female with   1. Hospital discharge follow-up - reviewed imaging and labs from 09/10/16  2. Excessive flatus - Simethicone as needed for abdominal pain/gas - Avoid gas-producing foods such as cabbage, onions, broccoli, brussel sprouts, wheat, and potatoes  - Follow a low FODMAP diet  3. Pain of upper abdomen - continues to endorse some mild upper abdominal pain worse after eating. Will obtain RUQ ultrasound to r/o gallbladder pathology, since this is more sensitive than CT - US Abdomen Limited RUQ; Future   Patient education and anticipatory guidance given Patient agrees with treatment plan Follow-up as needed if symptoms worsen or fail to improve  Levonne Hubertharley E. Renee Behrmann PA-C

## 2016-09-11 NOTE — Patient Instructions (Addendum)
-   Simethicone as needed for abdominal pain/gas - Avoid gas-producing foods such as cabbage, onions, broccoli, brussel sprouts, wheat, and potatoes  - Follow a low FODMAP diet - You will get a phone call to schedule your ultrasound or you can call [phone]

## 2016-09-15 ENCOUNTER — Ambulatory Visit (INDEPENDENT_AMBULATORY_CARE_PROVIDER_SITE_OTHER): Payer: BC Managed Care – PPO

## 2016-09-15 DIAGNOSIS — R1011 Right upper quadrant pain: Secondary | ICD-10-CM

## 2016-09-15 DIAGNOSIS — R1010 Upper abdominal pain, unspecified: Secondary | ICD-10-CM

## 2016-09-15 NOTE — Progress Notes (Signed)
Hi Renee Hopkins,  Good news, your ultrasound was normal. Your gallbladder is not the cause of this abdominal pain.  Best, Vinetta Bergamoharley

## 2016-09-23 DIAGNOSIS — R1010 Upper abdominal pain, unspecified: Secondary | ICD-10-CM | POA: Insufficient documentation

## 2016-09-23 DIAGNOSIS — R143 Flatulence: Secondary | ICD-10-CM | POA: Insufficient documentation

## 2016-10-06 ENCOUNTER — Ambulatory Visit: Payer: BC Managed Care – PPO | Admitting: Physician Assistant

## 2016-10-06 ENCOUNTER — Other Ambulatory Visit: Payer: Self-pay | Admitting: Physician Assistant

## 2016-10-06 ENCOUNTER — Telehealth: Payer: Self-pay | Admitting: Physician Assistant

## 2016-10-06 DIAGNOSIS — R143 Flatulence: Secondary | ICD-10-CM

## 2016-10-06 DIAGNOSIS — Z1231 Encounter for screening mammogram for malignant neoplasm of breast: Secondary | ICD-10-CM

## 2016-10-06 DIAGNOSIS — R1010 Upper abdominal pain, unspecified: Secondary | ICD-10-CM

## 2016-10-06 NOTE — Telephone Encounter (Signed)
Ok let patient know referral was made.

## 2016-10-06 NOTE — Telephone Encounter (Signed)
Pt called. She is still having an issue with bad stomach pains and was seen already for the same thing by another provider in this office She wants to be referred to a Specialist if that's our recommendation.  Thank you

## 2016-10-08 ENCOUNTER — Ambulatory Visit (INDEPENDENT_AMBULATORY_CARE_PROVIDER_SITE_OTHER): Payer: BC Managed Care – PPO

## 2016-10-08 DIAGNOSIS — Z1231 Encounter for screening mammogram for malignant neoplasm of breast: Secondary | ICD-10-CM | POA: Diagnosis not present

## 2016-10-10 NOTE — Telephone Encounter (Signed)
Patient advised.

## 2016-10-17 ENCOUNTER — Ambulatory Visit (INDEPENDENT_AMBULATORY_CARE_PROVIDER_SITE_OTHER): Payer: BC Managed Care – PPO | Admitting: Physician Assistant

## 2016-10-17 ENCOUNTER — Telehealth: Payer: Self-pay

## 2016-10-17 ENCOUNTER — Encounter: Payer: Self-pay | Admitting: Physician Assistant

## 2016-10-17 VITALS — BP 108/68 | HR 80 | Resp 18 | Wt 195.0 lb

## 2016-10-17 DIAGNOSIS — N898 Other specified noninflammatory disorders of vagina: Secondary | ICD-10-CM | POA: Diagnosis not present

## 2016-10-17 LAB — WET PREP, GENITAL
Clue Cells Wet Prep HPF POC: NONE SEEN
TRICH WET PREP: NONE SEEN
YEAST WET PREP: NONE SEEN

## 2016-10-17 MED ORDER — FLUCONAZOLE 150 MG PO TABS: 150.0000 mg | ORAL_TABLET | Freq: Once | ORAL | 0 refills | Status: AC

## 2016-10-17 NOTE — Telephone Encounter (Signed)
Negative for yeast, trich, BV. Other testing will take time. Discharge could be a normal variant. At this point no need to take diflucan.

## 2016-10-17 NOTE — Patient Instructions (Signed)
Sent diflucan will call you with results.

## 2016-10-17 NOTE — Progress Notes (Signed)
   Subjective:    Patient ID: Renee Hopkins, female    DOB: [birth year], 51 y.o.   MRN: [id number]  HPI  Pt is a 51 yo female who presents to the clinic with vaginal discharge. She does want to be tested for STD. She is sexually active with one partner but she would like to be tested. She denies any itching but she does feel some pelvic pressure and "irritation" like feeling. Denies any odor. She thought some of discharge appeared yellow. No fever, chills, lower abdominal pain.   . Active Ambulatory Problems    Diagnosis Date Noted  . Abnormal weight gain 04/24/2014  . Herpes simplex 05/14/2014  . Intertrigo 06/01/2014  . Overweight (BMI 25.0-29.9) 10/23/2014  . Cervical radiculopathy at C7 04/16/2015  . Overweight 05/07/2015  . Left lumbar radiculopathy 11/09/2015  . Felon of finger 11/21/2015  . Thumb pain 11/21/2015  . Craving for particular food 02/19/2016  . Acrochordon 07/25/2016  . Excessive flatus 09/23/2016  . Pain of upper abdomen 09/23/2016   Resolved Ambulatory Problems    Diagnosis Date Noted  . Obesity 04/24/2014  . Muscle spasm of back 04/16/2015  . Muscle strain of left upper back 04/16/2015  . 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 Arthritis       Review of Systems  All other systems reviewed and are negative.      Objective:   Physical Exam  Constitutional: She is oriented to person, place, and time. She appears well-developed and well-nourished.  HENT:  Head: Normocephalic and atraumatic.  Cardiovascular: Normal rate, regular rhythm and normal heart sounds.   Pulmonary/Chest: Effort normal and breath sounds normal.  Abdominal: Soft. Bowel sounds are normal. She exhibits no distension and no mass. There is no tenderness. There is no rebound and no guarding.  Genitourinary: Vaginal discharge found.  Genitourinary Comments: No adnexal tenderness or masses.  Clear to white  vaginal discharge.    Neurological: She is alert and oriented to person, place, and time.  Skin: Skin is dry.  Psychiatric: She has a normal mood and affect. Her behavior is normal.          Assessment & Plan:  Marland KitchenMarland KitchenNikiya was seen today for vaginitis.  Diagnoses and all orders for this visit:  Vaginal discharge -     GC/Chlamydia Probe Amp -     Wet prep, genital  Other orders -     fluconazole (DIFLUCAN) 150 MG tablet; Take 1 tablet (150 mg total) by mouth once. Repeat in 48 to 72 hours if symptoms persist.   Appears more like yeast than anything else. Will treat since weekend. Will call with any other results and/or treatment plan. If all normal then likely a normal discharge.

## 2016-10-17 NOTE — Telephone Encounter (Signed)
Wet prep results are in lab results.

## 2016-10-18 ENCOUNTER — Encounter: Payer: Self-pay | Admitting: Physician Assistant

## 2016-10-18 LAB — GC/CHLAMYDIA PROBE AMP
CT PROBE, AMP APTIMA: NOT DETECTED
GC Probe RNA: NOT DETECTED

## 2016-12-22 ENCOUNTER — Ambulatory Visit (INDEPENDENT_AMBULATORY_CARE_PROVIDER_SITE_OTHER): Payer: BC Managed Care – PPO | Admitting: Physician Assistant

## 2016-12-22 VITALS — BP 144/71 | HR 91 | Ht 70.0 in | Wt 201.0 lb

## 2016-12-22 DIAGNOSIS — N898 Other specified noninflammatory disorders of vagina: Secondary | ICD-10-CM

## 2016-12-22 MED ORDER — METRONIDAZOLE 500 MG PO TABS: 500.0000 mg | ORAL_TABLET | Freq: Two times a day (BID) | ORAL | 0 refills | Status: DC

## 2016-12-22 MED ORDER — FLUCONAZOLE 150 MG PO TABS: 150.0000 mg | ORAL_TABLET | Freq: Once | ORAL | 0 refills | Status: AC

## 2016-12-22 NOTE — Patient Instructions (Signed)

## 2016-12-22 NOTE — Progress Notes (Signed)
Subjective:    Patient ID: Renee Hopkins, female    DOB: 05/15/1965, 51 y.o.   MRN: [id number]  HPI Pt is a 51 yo pleasant female who presents to the clinic with vaginal discharge and irritation for 6 days. At first symptoms were not so bad but have worsened discharge and irritation. Now she feels like her labia is swollen and it is hard for her to walk.  She describes it like there is a corn cob stuck in her vagina.  She had a similar episode a few months ago and was given Diflucan and symptoms cleared.  She did take 1 Diflucan and symptoms improved but have not cleared.  And she is continuing to worsen.  She was last sexually active about 1 week ago.  She has had no new sexual partners.  When this happened a few months ago.  She did have GC and chlamydia testing that was negative. She is not having and dysuria, abdominal, or flank pain. No n/v. Pt only cleans with water. No soaps or lotions or cleansing products.   .. Active Ambulatory Problems    Diagnosis Date Noted  . Abnormal weight gain 04/24/2014  . Herpes simplex 05/14/2014  . Intertrigo 06/01/2014  . Overweight (BMI 25.0-29.9) 10/23/2014  . Cervical radiculopathy at C7 04/16/2015  . Overweight 05/07/2015  . Left lumbar radiculopathy 11/09/2015  . Felon of finger 11/21/2015  . Thumb pain 11/21/2015  . Craving for particular food 02/19/2016  . Acrochordon 07/25/2016  . Excessive flatus 09/23/2016  . Pain of upper abdomen 09/23/2016   Resolved Ambulatory Problems    Diagnosis Date Noted  . Obesity 04/24/2014  . Muscle spasm of back 04/16/2015  . Muscle strain of left upper back 04/16/2015  . 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 Arthritis           Review of Systems  Constitutional: Negative.   HENT: Negative.   Gastrointestinal: Negative.   Psychiatric/Behavioral: Negative.        Objective:   Physical Exam  Constitutional: She is  oriented to person, place, and time. She appears well-developed and well-nourished.  HENT:  Head: Normocephalic and atraumatic.  Cardiovascular: Normal rate, regular rhythm and normal heart sounds.   Pulmonary/Chest: Effort normal and breath sounds normal.  Abdominal: Soft. Bowel sounds are normal. She exhibits no distension and no mass. There is no tenderness. There is no rebound and no guarding.  Genitourinary:  Genitourinary Comments: Swollen labia minora no external discharge present.  Tenderness to speculum exam. Active green to white thick discharge with fishy odor.  Neurological: She is alert and oriented to person, place, and time.  Psychiatric: She has a normal mood and affect. Her behavior is normal.          Assessment & Plan:  Renee Hopkins Kitchen.Renee Hopkins Kitchen.Renee Hopkins was seen today for vaginal irritation.  Diagnoses and all orders for this visit:  Vaginal irritation -     metroNIDAZOLE (FLAGYL) 500 MG tablet; Take 1 tablet (500 mg total) by mouth 2 (two) times daily. For 7 days. -     fluconazole (DIFLUCAN) 150 MG tablet; Take 1 tablet (150 mg total) by mouth once. Repeat in 48 to 72 hours if symptoms persist. -     SureSwab Bacterial Vaginosis/itis -     C. trachomatis/N. gonorrhoeae RNA  Vaginal discharge -     metroNIDAZOLE (FLAGYL) 500 MG tablet; Take 1 tablet (500 mg total) by mouth  2 (two) times daily. For 7 days. -     fluconazole (DIFLUCAN) 150 MG tablet; Take 1 tablet (150 mg total) by mouth once. Repeat in 48 to 72 hours if symptoms persist. -     SureSwab Bacterial Vaginosis/itis -     C. trachomatis/N. gonorrhoeae RNA   I did since sure swab for BV, yeast, GC and chlamydia.  I did empirically treat for yeast and bacterial vaginosis.  We can certainly change up treatment if testing shows otherwise.  Encourage patient to use sitz bath soaks.  Avoid any scented soaps or cleaning devices. Follow up as needed.

## 2016-12-23 ENCOUNTER — Encounter: Payer: Self-pay | Admitting: Physician Assistant

## 2016-12-23 LAB — C. TRACHOMATIS/N. GONORRHOEAE RNA
C. TRACHOMATIS RNA, TMA: NOT DETECTED
N. GONORRHOEAE RNA, TMA: NOT DETECTED

## 2016-12-23 NOTE — Progress Notes (Signed)
Call pt: gonorrhea and chlamydia negative.

## 2016-12-24 MED ORDER — PHENTERMINE HCL 37.5 MG PO TABS: 37.5000 mg | ORAL_TABLET | Freq: Every day | ORAL | 0 refills | Status: DC

## 2016-12-24 NOTE — Addendum Note (Signed)
Addended by: Jomarie LongsBREEBACK, Leinaala Catanese L on: 12/24/2016 03:55 PM   Modules accepted: Orders

## 2016-12-29 LAB — SURESWAB BACTERIAL VAGINOSIS/ITIS
ATOPOBIUM VAGINAE: NOT DETECTED Log (cells/mL)
BV CATEGORY: UNDETERMINED — AB
C. PARAPSILOSIS, DNA: NOT DETECTED
C. TROPICALIS, DNA: NOT DETECTED
C. albicans, DNA: DETECTED — AB
C. glabrata, DNA: NOT DETECTED
MEGASPHAERA SPECIES: NOT DETECTED Log (cells/mL)
TRICHOMONAS VAGINALIS RNA: NOT DETECTED

## 2017-05-21 ENCOUNTER — Encounter: Payer: Self-pay | Admitting: Sports Medicine

## 2017-05-21 ENCOUNTER — Ambulatory Visit: Payer: BC Managed Care – PPO | Admitting: Sports Medicine

## 2017-05-21 DIAGNOSIS — J310 Chronic rhinitis: Secondary | ICD-10-CM | POA: Diagnosis not present

## 2017-05-21 MED ORDER — FLUTICASONE PROPIONATE 50 MCG/ACT NA SUSP: NASAL | 3 refills | Status: DC

## 2017-05-21 MED ORDER — AMOXICILLIN-POT CLAVULANATE 875-125 MG PO TABS: 1.0000 | ORAL_TABLET | Freq: Two times a day (BID) | ORAL | 0 refills | Status: AC

## 2017-05-21 MED ORDER — FLUCONAZOLE 150 MG PO TABS: 150.0000 mg | ORAL_TABLET | Freq: Once | ORAL | 0 refills | Status: AC

## 2017-05-21 MED ORDER — LEVOCETIRIZINE DIHYDROCHLORIDE 5 MG PO TABS: 5.0000 mg | ORAL_TABLET | Freq: Every evening | ORAL | 3 refills | Status: DC

## 2017-05-21 NOTE — Patient Instructions (Signed)
Nonallergic Rhinitis Nonallergic rhinitis is a condition that causes symptoms that affect the nose, such as a runny nose and a stuffed-up nose (nasal congestion) that can make it hard to breathe through the nose. This condition is different from having an allergy (allergic rhinitis). Allergic rhinitis occurs when the body's defense system (immune system) reacts to a substance that you are allergic to (allergen), such as pollen, pet dander, mold, or dust. Nonallergic rhinitis has many similar symptoms, but it is not caused by allergens. Nonallergic rhinitis can be a short-term or long-term problem. What are the causes? This condition can be caused by many different things. Some common types of nonallergic rhinitis include: Infectious rhinitis  This is usually due to an infection in the upper respiratory tract. Vasomotor rhinitis  This is the most common type of long-term nonallergic rhinitis.  It is caused by too much blood flow through the nose, which makes the tissue inside of the nose swell.  Symptoms are often triggered by strong odors, cold air, stress, drinking alcohol, cigarette smoke, or changes in the weather. Occupational rhinitis  This type is caused by triggers in the workplace, such as chemicals, dusts, animal dander, or air pollution. Hormonal rhinitis  This type occurs in women as a result of an increase in the female hormone estrogen.  It may occur during pregnancy, puberty, and menstrual cycles.  Symptoms improve when estrogen levels drop. Drug-induced rhinitis Several drugs can cause nonallergic rhinitis, including:  Medicines that are used to treat high blood pressure, heart disease, and Parkinson disease.  Aspirin and NSAIDs.  Over-the-counter nasal decongestant sprays. These can cause a type of nonallergic rhinitis (rhinitis medicamentosa) when they are used for more than a few days.  Nonallergic rhinitis with eosinophilia syndrome (NARES)  This type is caused  by having too much of a certain type of white blood cell (eosinophil). Nonallergic rhinitis can also be caused by a reaction to eating hot or spicy foods. This does not usually cause long-term symptoms. In some cases, the cause of nonallergic rhinitis is not known. What increases the risk? You are more likely to develop this condition if:  You are 30-60 years of age.  You are a woman. Women are twice as likely to have this condition.  What are the signs or symptoms? Common symptoms of this condition include:  Nasal congestion.  Runny nose.  The feeling of mucus going down the back of the throat (postnasal drip).  Trouble sleeping at night and daytime sleepiness.  Less common symptoms include:  Sneezing.  Coughing.  Itchy nose.  Bloodshot eyes.  How is this diagnosed? This condition may be diagnosed based on:  Your symptoms and medical history.  A physical exam.  Allergy testing to rule out allergic rhinitis. You may have skin tests or blood tests.  In some cases, the health care provider may take a swab of nasal secretions to look for an increased number of eosinophils. This would be done to confirm a diagnosis of NARES. How is this treated? Treatment for this condition depends on the cause. No single treatment works for everyone. Work with your health care provider to find the best treatment for you. Treatment may include:  Avoiding the things that trigger your symptoms.  Using medicines to relieve congestion, such as: ? Steroid nasal spray. There are many types. You may need to try a few to find out which one works best. ? Decongestant medicine. This may be an oral medicine or a nasal spray. These   medicines are only used for a short time.  Using medicines to relieve a runny nose. These may include antihistamine medicines or anticholinergic nasal sprays.  Surgery to remove tissue from inside the nose may be needed in severe cases if the condition has not improved  after 6-12 months of medical treatment. Follow these instructions at home:  Take or use over-the-counter and prescription medicines only as told by your health care provider. Do not stop using your medicine even if you start to feel better.  Use salt-water (saline) rinses or other solutions (nasal washes or irrigations) to wash or rinse out the inside of your nose as told by your health care provider.  Do not take NSAIDs or medicines that contain aspirin if they make your symptoms worse.  Do not drink alcohol if it makes your symptoms worse.  Do not use any tobacco products, such as cigarettes, chewing tobacco, and e-cigarettes. If you need help quitting, ask your health care provider.  Avoid secondhand smoke.  Get some exercise every day. Exercise may help reduce symptoms of nonallergic rhinitis for some people. Ask your health care provider how much exercise and what types of exercise are safe for you.  Sleep with the head of your bed raised (elevated). This may reduce nighttime nasal congestion.  Keep all follow-up visits as told by your health care provider. This is important. Contact a health care provider if:  You have a fever.  Your symptoms are getting worse at home.  Your symptoms are not responding to medicine.  You develop new symptoms, especially a headache or nosebleed. This information is not intended to replace advice given to you by your health care provider. Make sure you discuss any questions you have with your health care provider. Document Released: 06/04/2015 Document Revised: 07/19/2015 Document Reviewed: 05/03/2015 Elsevier Interactive Patient Education  2018 [employer]vier Inc.  

## 2017-05-21 NOTE — Assessment & Plan Note (Signed)
I do not think this is quite a sinus infection yet. Adding Flonase, Xyzal. I am going to write her a prescription for Augmentin and Diflucan, she should hold onto this, if symptoms persist another 5-7 days or declare themselves over 1 of her sinuses then she can take it.

## 2017-05-21 NOTE — Progress Notes (Signed)
Subjective:    CC: Feeling sick  HPI: For the past 2 days this pleasant 52 year old female has had increased runny nose, stuffiness, pressure behind her nose but no pressure over her face, cheek bones or frontal sinuses.  Symptoms are moderate, persistent, no cough, GI symptoms, no skin rash, no constitutional symptoms.  I reviewed the past medical history, family history, social history, surgical history, and allergies today and no changes were needed.  Please see the problem list section below in epic for further details.  Past Medical History: Past Medical History:  Diagnosis Date  . Arthritis    back   Past Surgical History: Past Surgical History:  Procedure Laterality Date  . CERVICAL DISC ARTHROPLASTY N/A 08/08/2015   Procedure: Cervical five-six Cervical six-seven  Anterior cervical Arthroplasty;  Surgeon: Coletta MemosKyle Cabbell, MD;  Location: [facility];  Service: Neurosurgery;  Laterality: N/A;  . CYSTECTOMY Right    shoulder  . SHOULDER SURGERY     Social History: Social History   Socioeconomic History  . Marital status: Married    Spouse name: Not on file  . Number of children: Not on file  . Years of education: Not on file  . Highest education level: Not on file  Occupational History  . Not on file  Social Needs  . Financial resource strain: Not on file  . Food insecurity:    Worry: Not on file    Inability: Not on file  . Transportation needs:    Medical: Not on file    Non-medical: Not on file  Tobacco Use  . Smoking status: Never Smoker  . Smokeless tobacco: Never Used  Substance and Sexual Activity  . Alcohol use: Yes    Alcohol/week: 0.0 oz    Comment: rarely  . Drug use: No  . Sexual activity: Yes    Partners: Male  Lifestyle  . Physical activity:    Days per week: Not on file    Minutes per session: Not on file  . Stress: Not on file  Relationships  . Social connections:    Talks on phone: Not on file    Gets together: Not on file    Attends  religious service: Not on file    Active member of club or organization: Not on file    Attends meetings of clubs or organizations: Not on file    Relationship status: Not on file  Other Topics Concern  . Not on file  Social History Narrative  . Not on file   Family History: Family History  Problem Relation Age of Onset  . Diabetes Father   . Hypertension Father   . Hypertension Brother   . Stroke Maternal Grandmother    Allergies: Allergies  Allergen Reactions  . Oxycodone-Acetaminophen Nausea And Vomiting   Medications: See med rec.  Review of Systems: No fevers, chills, night sweats, weight loss, chest pain, or shortness of breath.   Objective:    General: Well Developed, well nourished, and in no acute distress.  Neuro: Alert and oriented x3, extra-ocular muscles intact, sensation grossly intact.  HEENT: Normocephalic, atraumatic, pupils equal round reactive to light, neck supple, no masses, no lymphadenopathy, thyroid nonpalpable.  Oropharynx, ear canals unremarkable, boggy turbinates without erythema.  No tenderness over the frontal or maxillary sinuses Skin: Warm and dry, no rashes. Cardiac: Regular rate and rhythm, no murmurs rubs or gallops, no lower extremity edema.  Respiratory: Clear to auscultation bilaterally. Not using accessory muscles, speaking in full sentences.  Impression  and Recommendations:    Rhinitis, non-allergic I do not think this is quite a sinus infection yet. Adding Flonase, Xyzal. I am going to write her a prescription for Augmentin and Diflucan, she should hold onto this, if symptoms persist another 5-7 days or declare themselves over 1 of her sinuses then she can take it. ___________________________________________ Ihor Austin. Benjamin Stain, M.D., ABFM., CAQSM. Primary Care and Sports Medicine [facility]  Adjunct Instructor of Family Medicine  [employer]

## 2017-07-27 ENCOUNTER — Ambulatory Visit: Payer: BC Managed Care – PPO | Admitting: Physician Assistant

## 2017-07-27 ENCOUNTER — Encounter: Payer: Self-pay | Admitting: Physician Assistant

## 2017-07-27 VITALS — BP 127/82 | HR 71 | Temp 98.2°F | Wt 218.0 lb

## 2017-07-27 DIAGNOSIS — L239 Allergic contact dermatitis, unspecified cause: Secondary | ICD-10-CM | POA: Diagnosis not present

## 2017-07-27 MED ORDER — TRIAMCINOLONE ACETONIDE 0.1 % EX CREA: 1.0000 "application " | TOPICAL_CREAM | Freq: Two times a day (BID) | CUTANEOUS | 0 refills | Status: AC

## 2017-07-27 NOTE — Progress Notes (Signed)
HPI:                                                                Renee PicklesMelanie Hopkins is a 52 y.o. female [facility] today for bilateral hand rash  Onset: 1 month Location: started on left pinky finger, has spread to other digits of b/l hands Duration: constant Character: extremely pruritic  Aggravating factors / Triggers: none Treatments tried: lotion, Benadryl  Recent illness / systemic symptoms: none  Animal/insect exposure: kitten at home History of allergies: yes, environmental Exposure to new soaps, perfumes, cleaning products: none Exposure to chemicals: none   No flowsheet data found.    Past Medical History:  Diagnosis Date  . Arthritis    back   Past Surgical History:  Procedure Laterality Date  . CERVICAL DISC ARTHROPLASTY N/A 08/08/2015   Procedure: Cervical five-six Cervical six-seven  Anterior cervical Arthroplasty;  Surgeon: Coletta MemosKyle Cabbell, MD;  Location: [facility];  Service: Neurosurgery;  Laterality: N/A;  . CYSTECTOMY Right    shoulder  . SHOULDER SURGERY     Social History   Tobacco Use  . Smoking status: Never Smoker  . Smokeless tobacco: Never Used  Substance Use Topics  . Alcohol use: Yes    Alcohol/week: 0.0 oz    Comment: rarely   family history includes Diabetes in her father; Hypertension in her brother and father; Stroke in her maternal grandmother.    ROS: negative except as noted in the HPI  Medications: Current Outpatient Medications  Medication Sig Dispense Refill  . buPROPion (WELLBUTRIN XL) 300 MG 24 hr tablet TAKE 1 TABLET (300 MG TOTAL) BY MOUTH DAILY. 30 tablet 3  . fluticasone (FLONASE) 50 MCG/ACT nasal spray One spray in each nostril twice a day, use left hand for right nostril, and right hand for left nostril. 48 g 3  . levocetirizine (XYZAL) 5 MG tablet Take 1 tablet (5 mg total) by mouth every evening. 90 tablet 3  . metroNIDAZOLE (FLAGYL) 500 MG tablet  Take 1 tablet (500 mg total) by mouth 2 (two) times daily. For 7 days. 14 tablet 0  . phentermine (ADIPEX-P) 37.5 MG tablet Take 1 tablet (37.5 mg total) by mouth daily before breakfast. 30 tablet 0  . valACYclovir (VALTREX) 500 MG tablet Take 1 tablet (500 mg total) by mouth 2 (two) times daily. For 3 days for outbreak. 30 tablet 1   No current facility-administered medications for this visit.    Allergies  Allergen Reactions  . Oxycodone-Acetaminophen Nausea And Vomiting       Objective:  BP 127/82   Pulse 71   Temp 98.2 F (36.8 C) (Oral)   Wt 218 lb (98.9 kg)   BMI 31.28 kg/m  Gen:  alert, not ill-appearing, no distress, appropriate for age HEENT: head normocephalic without obvious abnormality, conjunctiva and cornea clear, wearing glasses, trachea midline Pulm: Normal work of breathing, normal phonation MSK: extremities atraumatic, normal gait and station Skin: dorsal aspect of left fifth digit and DIP of right first and third digits with erythematous papular rash Psych: well-groomed, cooperative, good eye contact, euthymic mood, affect mood-congruent, speech is articulate, and thought processes clear and goal-directed    No results found for this or any  previous visit (from the past 72 hour(s)). No results found.    Assessment and Plan: 52 y.o. female with   1. 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

## 2017-07-27 NOTE — Patient Instructions (Signed)
- apply Triamcinolone steroid cream to affected areas twice a day for no longer than 2 weeks on the same area - apply an unscented cream to your hands every 3 hours, apply it at least 15 minutes after the steroid cream - can also do Aquafor or Vaseline at bedtime and wear cotton gloves   Contact Dermatitis Dermatitis is redness, soreness, and swelling (inflammation) of the skin. Contact dermatitis is a reaction to certain substances that touch the skin. There are two types of contact dermatitis:  Irritant contact dermatitis. This type is caused by something that irritates your skin, such as dry hands from washing them too much. This type does not require previous exposure to the substance for a reaction to occur. This type is more common.  Allergic contact dermatitis. This type is caused by a substance that you are allergic to, such as a nickel allergy or poison ivy. This type only occurs if you have been exposed to the substance (allergen) before. Upon a repeat exposure, your body reacts to the substance. This type is less common.  What are the causes? Many different substances can cause contact dermatitis. Irritant contact dermatitis is most commonly caused by exposure to:  Makeup.  Soaps.  Detergents.  Bleaches.  Acids.  Metal salts, such as nickel.  Allergic contact dermatitis is most commonly caused by exposure to:  Poisonous plants.  Chemicals.  Jewelry.  Latex.  Medicines.  Preservatives in products, such as clothing.  What increases the risk? This condition is more likely to develop in:  People who have jobs that expose them to irritants or allergens.  People who have certain medical conditions, such as asthma or eczema.  What are the signs or symptoms? Symptoms of this condition may occur anywhere on your body where the irritant has touched you or is touched by you. Symptoms include:  Dryness or flaking.  Redness.  Cracks.  Itching.  Pain or a  burning feeling.  Blisters.  Drainage of small amounts of blood or clear fluid from skin cracks.  With allergic contact dermatitis, there may also be swelling in areas such as the eyelids, mouth, or genitals. How is this diagnosed? This condition is diagnosed with a medical history and physical exam. A patch skin test may be performed to help determine the cause. If the condition is related to your job, you may need to see an occupational medicine specialist. How is this treated? Treatment for this condition includes figuring out what caused the reaction and protecting your skin from further contact. Treatment may also include:  Steroid creams or ointments. Oral steroid medicines may be needed in more severe cases.  Antibiotics or antibacterial ointments, if a skin infection is present.  Antihistamine lotion or an antihistamine taken by mouth to ease itching.  A bandage (dressing).  Follow these instructions at home: Skin Care  Moisturize your skin as needed.  Apply cool compresses to the affected areas.  Try taking a bath with: ? Epsom salts. Follow the instructions on the packaging. You can get these at your local pharmacy or grocery store. ? Baking soda. Pour a small amount into the bath as directed by your health care provider. ? Colloidal oatmeal. Follow the instructions on the packaging. You can get this at your local pharmacy or grocery store.  Try applying baking soda paste to your skin. Stir water into baking soda until it reaches a paste-like consistency.  Do not scratch your skin.  Bathe less frequently, such as every other  day.  Bathe in lukewarm water. Avoid using hot water. Medicines  Take or apply over-the-counter and prescription medicines only as told by your health care provider.  If you were prescribed an antibiotic medicine, take or apply your antibiotic as told by your health care provider. Do not stop using the antibiotic even if your condition starts  to improve. General instructions  Keep all follow-up visits as told by your health care provider. This is important.  Avoid the substance that caused your reaction. If you do not know what caused it, keep a journal to try to track what caused it. Write down: ? What you eat. ? What cosmetic products you use. ? What you drink. ? What you wear in the affected area. This includes jewelry.  If you were given a dressing, take care of it as told by your health care provider. This includes when to change and remove it. Contact a health care provider if:  Your condition does not improve with treatment.  Your condition gets worse.  You have signs of infection such as swelling, tenderness, redness, soreness, or warmth in the affected area.  You have a fever.  You have new symptoms. Get help right away if:  You have a severe headache, neck pain, or neck stiffness.  You vomit.  You feel very sleepy.  You notice red streaks coming from the affected area.  Your bone or joint underneath the affected area becomes painful after the skin has healed.  The affected area turns darker.  You have difficulty breathing. This information is not intended to replace advice given to you by your health care provider. Make sure you discuss any questions you have with your health care provider. Document Released: 02/08/2000 Document Revised: 07/19/2015 Document Reviewed: 06/28/2014 Elsevier Interactive Patient Education  2018 [employer].

## 2017-08-11 ENCOUNTER — Encounter: Payer: Self-pay | Admitting: Physician Assistant

## 2017-08-11 ENCOUNTER — Ambulatory Visit: Payer: BC Managed Care – PPO | Admitting: Physician Assistant

## 2017-08-11 VITALS — BP 117/72 | HR 77 | Temp 98.4°F | Resp 16 | Ht 70.0 in | Wt 216.0 lb

## 2017-08-11 DIAGNOSIS — L249 Irritant contact dermatitis, unspecified cause: Secondary | ICD-10-CM | POA: Diagnosis not present

## 2017-08-11 DIAGNOSIS — E6609 Other obesity due to excess calories: Secondary | ICD-10-CM | POA: Diagnosis not present

## 2017-08-11 DIAGNOSIS — Z6831 Body mass index (BMI) 31.0-31.9, adult: Secondary | ICD-10-CM | POA: Diagnosis not present

## 2017-08-11 MED ORDER — CLOBETASOL PROPIONATE 0.05 % EX CREA: 1.0000 "application " | TOPICAL_CREAM | Freq: Two times a day (BID) | CUTANEOUS | 2 refills | Status: DC

## 2017-08-11 MED ORDER — PREDNISONE 50 MG PO TABS: ORAL_TABLET | ORAL | 0 refills | Status: DC

## 2017-08-11 MED ORDER — PHENTERMINE HCL 15 MG PO CAPS
15.0000 mg | ORAL_CAPSULE | ORAL | 1 refills | Status: DC
Start: 2017-08-11 — End: 2017-10-19

## 2017-08-11 MED ORDER — TOPIRAMATE 25 MG PO TABS: 25.0000 mg | ORAL_TABLET | Freq: Two times a day (BID) | ORAL | 1 refills | Status: DC

## 2017-08-11 MED ORDER — HYDROXYZINE HCL 50 MG PO TABS: 50.0000 mg | ORAL_TABLET | Freq: Three times a day (TID) | ORAL | 0 refills | Status: DC | PRN

## 2017-08-11 NOTE — Patient Instructions (Signed)
Contact Dermatitis Dermatitis is redness, soreness, and swelling (inflammation) of the skin. Contact dermatitis is a reaction to certain substances that touch the skin. You either touched something that irritated your skin, or you have allergies to something you touched. Follow these instructions at home: Skin Care  Moisturize your skin as needed.  Apply cool compresses to the affected areas.  Try taking a bath with: ? Epsom salts. Follow the instructions on the package. You can get these at a pharmacy or grocery store. ? Baking soda. Pour a small amount into the bath as told by your doctor. ? Colloidal oatmeal. Follow the instructions on the package. You can get this at a pharmacy or grocery store.  Try applying baking soda paste to your skin. Stir water into baking soda until it looks like paste.  Do not scratch your skin.  Bathe less often.  Bathe in lukewarm water. Avoid using hot water. Medicines  Take or apply over-the-counter and prescription medicines only as told by your doctor.  If you were prescribed an antibiotic medicine, take or apply your antibiotic as told by your doctor. Do not stop taking the antibiotic even if your condition starts to get better. General instructions  Keep all follow-up visits as told by your doctor. This is important.  Avoid the substance that caused your reaction. If you do not know what caused it, keep a journal to try to track what caused it. Write down: ? What you eat. ? What cosmetic products you use. ? What you drink. ? What you wear in the affected area. This includes jewelry.  If you were given a bandage (dressing), take care of it as told by your doctor. This includes when to change and remove it. Contact a doctor if:  You do not get better with treatment.  Your condition gets worse.  You have signs of infection such as: ? Swelling. ? Tenderness. ? Redness. ? Soreness. ? Warmth.  You have a fever.  You have new  symptoms. Get help right away if:  You have a very bad headache.  You have neck pain.  Your neck is stiff.  You throw up (vomit).  You feel very sleepy.  You see red streaks coming from the affected area.  Your bone or joint underneath the affected area becomes painful after the skin has healed.  The affected area turns darker.  You have trouble breathing. This information is not intended to replace advice given to you by your health care provider. Make sure you discuss any questions you have with your health care provider. Document Released: 12/08/2008 Document Revised: 07/19/2015 Document Reviewed: 06/28/2014 Elsevier Interactive Patient Education  2018 [employer]  

## 2017-08-12 ENCOUNTER — Encounter: Payer: Self-pay | Admitting: Physician Assistant

## 2017-08-12 NOTE — Progress Notes (Signed)
Subjective:    Patient ID: Renee PicklesMelanie Hopkins, female    DOB: 01/13/1966, 52 y.o.   MRN: [id number]  HPI Pt is a 52 yo female who presents to the clinic for rash on upper chest, in between breast and on upper abdomen. She has noticed if for the last week. Seems to be staying the same. Her cat is an outside cat and does lay in this area. She also admits to sweating a lot in this area. No fever, chills, body aches. It itches a lot. She has used triamcinolone cream she had at home. Never had anything like this before.   She also would like to restart phentermine. She feels like it works and she can afford it. She is not exercising. She has also gained the weight back from last time she was on it.   .. Active Ambulatory Problems    Diagnosis Date Noted  . Abnormal weight gain 04/24/2014  . Herpes simplex 05/14/2014  . Intertrigo 06/01/2014  . Overweight (BMI 25.0-29.9) 10/23/2014  . Cervical radiculopathy at C7 04/16/2015  . Overweight 05/07/2015  . Left lumbar radiculopathy 11/09/2015  . Felon of finger 11/21/2015  . Thumb pain 11/21/2015  . Craving for particular food 02/19/2016  . Acrochordon 07/25/2016  . Excessive flatus 09/23/2016  . Pain of upper abdomen 09/23/2016  . Rhinitis, non-allergic 05/21/2017   Resolved Ambulatory Problems    Diagnosis Date Noted  . Obesity 04/24/2014  . Muscle spasm of back 04/16/2015  . Muscle strain of left upper back 04/16/2015  . 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 Arthritis       Review of Systems  All other systems reviewed and are negative.      Objective:   Physical Exam  Constitutional: She is oriented to person, place, and time. She appears well-developed and well-nourished.  Obese.   HENT:  Head: Normocephalic and atraumatic.  Cardiovascular: Normal rate and regular rhythm.  Pulmonary/Chest: Effort normal and breath sounds normal.  Neurological:  She is alert and oriented to person, place, and time.  Skin:  macular papular erythematous rash on chest and down in between breast and on to the upper abdomen.    Psychiatric: She has a normal mood and affect. Her behavior is normal.          Assessment & Plan:  [name].[name].Renee Hopkins was seen today for rash for 1 wk.  Diagnoses and all orders for this visit:  Irritant contact dermatitis, unspecified trigger -     predniSONE (DELTASONE) 50 MG tablet; Take for 5 days. -     clobetasol cream (TEMOVATE) 0.05 %; Apply 1 application topically 2 (two) times daily. -     hydrOXYzine (ATARAX/VISTARIL) 50 MG tablet; Take 1 tablet (50 mg total) by mouth 3 (three) times daily as needed. For itching.  Class 1 obesity due to excess calories without serious comorbidity with body mass index (BMI) of 31.0 to 31.9 in adult -     phentermine 15 MG capsule; Take 1 capsule (15 mg total) by mouth every morning. -     topiramate (TOPAMAX) 25 MG tablet; Take 1 tablet (25 mg total) by mouth 2 (two) times daily.   .. [facility] 08/11/2017 10/17/2016  Decreased Interest 0 0  Down, Depressed, Hopeless 0 0  PHQ - 2 Score 0 0  Altered sleeping 0 -  Tired, decreased energy 0 -  Change in appetite 0 -  Feeling bad or failure about yourself  0 -  Trouble concentrating 0 -  Moving slowly or fidgety/restless 0 -  Suicidal thoughts 0 -  PHQ-9 Score 0 -  Difficult doing work/chores Not difficult at all -   Seems like contact irritant dermatitis. Could be the cat bringing in something for outside or could be some of a heat sweat reaction. Prednisone given for rash. Stronger topical steroid given. Discussed side effects and not to use for more than 2 weeks. Atarax given for itching. Keep area dry.   [name].Discussed low carb diet with 1500 calories and 80g of protein.  Exercising at least 150 minutes a week.  [employer] could be a Chief Technology Officer.  Given phentermine and topamax.  Recheck in 1 month.    [name].Spent 30 minutes with patient and greater than 50 percent of visit spent counseling patient regarding treatment plan.

## 2017-09-02 ENCOUNTER — Other Ambulatory Visit: Payer: Self-pay | Admitting: Physician Assistant

## 2017-09-02 DIAGNOSIS — Z6831 Body mass index (BMI) 31.0-31.9, adult: Principal | ICD-10-CM

## 2017-09-02 DIAGNOSIS — E6609 Other obesity due to excess calories: Secondary | ICD-10-CM

## 2017-09-02 DIAGNOSIS — L249 Irritant contact dermatitis, unspecified cause: Secondary | ICD-10-CM

## 2017-09-09 ENCOUNTER — Encounter: Payer: Self-pay | Admitting: Physician Assistant

## 2017-09-09 ENCOUNTER — Ambulatory Visit: Payer: BC Managed Care – PPO | Admitting: Physician Assistant

## 2017-09-09 VITALS — BP 123/64 | HR 86 | Temp 98.0°F | Wt 213.0 lb

## 2017-09-09 DIAGNOSIS — R220 Localized swelling, mass and lump, head: Secondary | ICD-10-CM | POA: Diagnosis not present

## 2017-09-09 NOTE — Progress Notes (Signed)
Subjective:    Patient ID: Renee Hopkins, female    DOB: 06/09/1965, 52 y.o.   MRN: [id number]  HPI Pt is a 52 yr old female presenting to the clinic complaining of bilateral eye swelling. She had the sensation that something was in her right eye yesterday and noticed her right tear duct region being swollen. She has tried applying cold compress and Tylenol with no relief. She woke up today with the same swelling of her left eye. She denies bug bite, any changes to her diet or new medicine.  She has had no trauma. She did mention being out in the sun over the weekend. She denies eye itchiness, eye discharge, vision changes, photophobia, eye pain, trauma to the eye, sinus pain, no URI or allergy symptoms, no redness or drainage noted. Pt denies contact lens use and only uses prescription glasses. She did mention some of her coworkers have been having eye issues causing them to go to the doctor. She is unsure of their diagnosis but says one had a stye.   .. Active Ambulatory Problems    Diagnosis Date Noted  . Class 1 obesity due to excess calories without serious comorbidity with body mass index (BMI) of 31.0 to 31.9 in adult 04/24/2014  . Abnormal weight gain 04/24/2014  . Herpes simplex 05/14/2014  . Intertrigo 06/01/2014  . Overweight (BMI 25.0-29.9) 10/23/2014  . Cervical radiculopathy at C7 04/16/2015  . Overweight 05/07/2015  . Left lumbar radiculopathy 11/09/2015  . Felon of finger 11/21/2015  . Thumb pain 11/21/2015  . Craving for particular food 02/19/2016  . Acrochordon 07/25/2016  . Excessive flatus 09/23/2016  . Pain of upper abdomen 09/23/2016  . Rhinitis, non-allergic 05/21/2017   Resolved Ambulatory Problems    Diagnosis Date Noted  . Muscle spasm of back 04/16/2015  . Muscle strain of left upper back 04/16/2015  . 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 Arthritis      Review of Systems   Constitutional: Negative for chills, fatigue and fever.  HENT: Negative.  Negative for congestion, ear discharge, ear pain, facial swelling, postnasal drip, rhinorrhea, sinus pressure, sinus pain and sore throat.   Eyes: Negative for photophobia, pain, discharge, redness, itching and visual disturbance.  Respiratory: Negative for cough, shortness of breath and wheezing.   Cardiovascular: Negative for chest pain and palpitations.  Musculoskeletal: Negative for arthralgias and myalgias.  Skin: Negative for rash and wound.       + bilateral mild swelling to tear duct region of the eye  Neurological: Negative for weakness and numbness.  All other systems reviewed and are negative.     Objective:   Physical Exam  Constitutional: She appears well-developed and well-nourished. No distress.  HENT:  Head: Normocephalic and atraumatic.  Eyes: Pupils are equal, round, and reactive to light. Conjunctivae, EOM and lids are normal. Lids are everted and swept, no foreign bodies found. Right eye exhibits no chemosis, no discharge and no exudate. No foreign body present in the right eye. Left eye exhibits no chemosis, no discharge and no exudate. No foreign body present in the left eye. Right conjunctiva is not injected. Right conjunctiva has no hemorrhage. Left conjunctiva is not injected. Left conjunctiva has no hemorrhage. Right eye exhibits normal extraocular motion. Left eye exhibits normal extraocular motion. Pupils are equal.  Neck: Normal range of motion.  Cardiovascular: Normal rate, regular rhythm and normal heart sounds.  Pulmonary/Chest: Effort normal  and breath sounds normal.  Musculoskeletal: Normal range of motion.  Neurological: She is alert.  Skin: Skin is warm and dry. No rash noted. No erythema.  + mild swelling to bilateral tear duct region of the eye. Redness of the skin over the forehead and cheek area consistent with slight irritation post sun exposure.      Assessment & Plan:    Marland KitchenMarland KitchenLeba was seen today for belepharitis.  Diagnoses and all orders for this visit:  Swelling of nose -     Basic metabolic panel  -No eye pain, skin changes, eye discharge indicating a possible infection. No eye itchiness or other URI/allergy symptoms indicating an allergic rxn. Only new thing was pt increased sun exposure. Could be related to sun exposure or long-term use of the sunglasses constricting and inflaming that region. Pt was told no RED flags at this time. To continue applying cold compresses, massage the area to help the swelling go down, and use Ibuprofen as needed for its anti-inflammtory use than for pain.   -Pt instructed to use Suncreen when out in the sun for skin CA prevention. Pt voiced understating.   -Pt told to check in by Friday and keep [place] informed if the swelling doesn't change, or if she experiences new symptoms including eye discharge, eye pain, photophobia, fever, or chills. Pt told if symptoms are not getting better or she continues to swell to get a blood work to check kidney function for possible explanation of the swelling.    I, Tandy Gaw PA-C, have reviewed and agree with the above documentation in it's entirety.

## 2017-10-03 ENCOUNTER — Other Ambulatory Visit: Payer: Self-pay | Admitting: Physician Assistant

## 2017-10-03 DIAGNOSIS — L249 Irritant contact dermatitis, unspecified cause: Secondary | ICD-10-CM

## 2017-10-03 DIAGNOSIS — E6609 Other obesity due to excess calories: Secondary | ICD-10-CM

## 2017-10-03 DIAGNOSIS — Z6831 Body mass index (BMI) 31.0-31.9, adult: Principal | ICD-10-CM

## 2017-10-19 ENCOUNTER — Encounter: Payer: Self-pay | Admitting: Physician Assistant

## 2017-10-19 ENCOUNTER — Ambulatory Visit: Payer: BC Managed Care – PPO | Admitting: Physician Assistant

## 2017-10-19 VITALS — BP 125/68 | HR 78 | Ht 70.0 in | Wt 208.0 lb

## 2017-10-19 DIAGNOSIS — B009 Herpesviral infection, unspecified: Secondary | ICD-10-CM

## 2017-10-19 DIAGNOSIS — Z1231 Encounter for screening mammogram for malignant neoplasm of breast: Secondary | ICD-10-CM

## 2017-10-19 DIAGNOSIS — Z1211 Encounter for screening for malignant neoplasm of colon: Secondary | ICD-10-CM

## 2017-10-19 DIAGNOSIS — R232 Flushing: Secondary | ICD-10-CM

## 2017-10-19 DIAGNOSIS — L301 Dyshidrosis [pompholyx]: Secondary | ICD-10-CM

## 2017-10-19 DIAGNOSIS — E663 Overweight: Secondary | ICD-10-CM

## 2017-10-19 MED ORDER — CRISABOROLE 2 % EX OINT: 1.0000 "application " | TOPICAL_OINTMENT | Freq: Two times a day (BID) | CUTANEOUS | 1 refills | Status: DC

## 2017-10-19 MED ORDER — VALACYCLOVIR HCL 500 MG PO TABS: 500.0000 mg | ORAL_TABLET | Freq: Two times a day (BID) | ORAL | 1 refills | Status: DC

## 2017-10-19 MED ORDER — PHENTERMINE HCL 15 MG PO CAPS: 15.0000 mg | ORAL_CAPSULE | ORAL | 0 refills | Status: DC

## 2017-10-19 MED ORDER — TOPIRAMATE 25 MG PO TABS: 25.0000 mg | ORAL_TABLET | Freq: Two times a day (BID) | ORAL | 0 refills | Status: DC

## 2017-10-19 NOTE — Progress Notes (Signed)
Subjective:    Patient ID: Renee Hopkins, female    DOB: 07/26/1965, 52 y.o.   MRN: [id number]  HPI  Pt is a 52 yo female who presents to the clinic to follow up on weight loss.   She currently on phentermine and topamax. She is doing well and lost 10lbs. She denies any problems with anxiety or sleeping or palpitations. She did feel a little "fuzzy" when started topamax but has resolved.   She has started to have hot flashes. She is having 4-10 a day. Her periods are every other month since April. Currently not doing anything to make better.   She continues to have some residual eczema of the fingers. Worse on left 5th finger. Very itchy. Using clobetasol and helps but has not gotten rid of it and comes back if she does not use every day. Also using eucerin hand lotion. She admits to washing her hands a lot with alcohol based hand sanitizers.   .. Active Ambulatory Problems    Diagnosis Date Noted  . Class 1 obesity due to excess calories without serious comorbidity with body mass index (BMI) of 31.0 to 31.9 in adult 04/24/2014  . Abnormal weight gain 04/24/2014  . Herpes simplex 05/14/2014  . Intertrigo 06/01/2014  . Overweight (BMI 25.0-29.9) 10/23/2014  . Cervical radiculopathy at C7 04/16/2015  . Overweight 05/07/2015  . Left lumbar radiculopathy 11/09/2015  . Felon of finger 11/21/2015  . Thumb pain 11/21/2015  . Craving for particular food 02/19/2016  . Acrochordon 07/25/2016  . Excessive flatus 09/23/2016  . Pain of upper abdomen 09/23/2016  . Rhinitis, non-allergic 05/21/2017  . Hot flashes 10/20/2017  . Eczema, dyshidrotic 10/20/2017   Resolved Ambulatory Problems    Diagnosis Date Noted  . Muscle spasm of back 04/16/2015  . Muscle strain of left upper back 04/16/2015  . 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 Arthritis       Review of Systems  All other systems reviewed and are  negative.      Objective:   Physical Exam  Constitutional: She is oriented to person, place, and time. She appears well-developed and well-nourished.  HENT:  Head: Normocephalic and atraumatic.  Cardiovascular: Normal rate and regular rhythm.  Pulmonary/Chest: Effort normal and breath sounds normal.  Neurological: She is alert and oriented to person, place, and time.  Skin:  Fingers dry irritated skin and some with raised flesh colored blisters. Worse on left pinky.   Psychiatric: She has a normal mood and affect. Her behavior is normal.          Assessment & Plan:  [name].[name].Diagnoses and all orders for this visit:  Overweight (BMI 25.0-29.9) -     topiramate (TOPAMAX) 25 MG tablet; Take 1 tablet (25 mg total) by mouth 2 (two) times daily. -     phentermine 15 MG capsule; Take 1 capsule (15 mg total) by mouth every morning.  Hot flashes  Colon cancer screening -     Ambulatory referral to Gastroenterology  Eczema, dyshidrotic -     Crisaborole (EUCRISA) 2 % OINT; Apply 1 application topically 2 (two) times daily.  Herpes -     valACYclovir (VALTREX) 500 MG tablet; Take 1 tablet (500 mg total) by mouth 2 (two) times daily. For 3 days for outbreak.  Visit for screening mammogram -     MM 3D SCREEN BREAST BILATERAL   Down 10lbs continue same phentermine/topamax regimen.  Discussed diet and exercise. Follow up in 3 months.   Stop topical steroid for eczema. Start eucrisa. Coupon card given. Stop using work alcohol base hand sanitizers. Continue eurcerin for moisture.   Discussed hot flashes and treatment. Pt not ready for HRT due to risks. Continue symptomatic and herbal remedies. HO given. Follow up as needed.   .. Active Ambulatory Problems    Diagnosis Date Noted  . Class 1 obesity due to excess calories without serious comorbidity with body mass index (BMI) of 31.0 to 31.9 in adult 04/24/2014  . Abnormal weight gain 04/24/2014  . Herpes simplex 05/14/2014  . Intertrigo  06/01/2014  . Overweight (BMI 25.0-29.9) 10/23/2014  . Cervical radiculopathy at C7 04/16/2015  . Overweight 05/07/2015  . Left lumbar radiculopathy 11/09/2015  . Felon of finger 11/21/2015  . Thumb pain 11/21/2015  . Craving for particular food 02/19/2016  . Acrochordon 07/25/2016  . Excessive flatus 09/23/2016  . Pain of upper abdomen 09/23/2016  . Rhinitis, non-allergic 05/21/2017  . Hot flashes 10/20/2017  . Eczema, dyshidrotic 10/20/2017   Resolved Ambulatory Problems    Diagnosis Date Noted  . Muscle spasm of back 04/16/2015  . Muscle strain of left upper back 04/16/2015  . 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 Arthritis

## 2017-10-19 NOTE — Patient Instructions (Signed)
Menopause and Herbal Products What is menopause? Menopause is the normal time of life when menstrual periods decrease in frequency and eventually stop completely. This process can take several years for some women. Menopause is complete when you have had an absence of menstruation for a full year since your last menstrual period. It usually occurs between the ages of 48 and 55. It is not common for menopause to begin before the age of 40. During menopause, your body stops producing the female hormones estrogen and progesterone. Common symptoms associated with this loss of hormones (vasomotor symptoms) are:  Hot flashes.  Hot flushes.  Night sweats. Other common symptoms and complications of menopause include:  Decrease in sex drive.  Vaginal dryness and thinning of the walls of the vagina. This can make sex painful.  Dryness of the skin and development of wrinkles.  Headaches.  Tiredness.  Irritability.  Memory problems.  Weight gain.  Bladder infections.  Hair growth on the face and chest.  Inability to reproduce offspring (infertility).  Loss of density in the bones (osteoporosis) increasing your risk for breaks (fractures).  Depression.  Hardening and narrowing of the arteries (atherosclerosis). This increases your risk of heart attack and stroke. What treatment options are available? There are many treatment choices for menopause symptoms. The most common treatment is hormone replacement therapy. Many alternative therapies for menopause are emerging, including the use of herbal products. These supplements can be found in the form of herbs, teas, oils, tinctures, and pills. Common herbal supplements for menopause are made from plants that contain phytoestrogens. Phytoestrogens are compounds that occur naturally in plants and plant products. They act like estrogen in the body. Foods and herbs that contain phytoestrogens include:  Soy.  Flax seeds.  Red  clover.  Ginseng. What menopause symptoms may be helped if I use herbal products?  Vasomotor symptoms. These may be helped by:  Soy. Some studies show that soy may have a moderate benefit for hot flashes.  Black cohosh. There is limited evidence indicating this may be beneficial for hot flashes.  Symptoms that are related to heart and blood vessel disease. These may be helped by soy. Studies have shown that soy can help to lower cholesterol.  Depression. This may be helped by:  St. John's wort. There is limited evidence that shows this may help mild to moderate depression.  Black cohosh. There is evidence that this may help depression and mood swings.  Osteoporosis. Soy may help to decrease bone loss that is associated with menopause and may prevent osteoporosis. Limited evidence indicates that red clover may offer some bone loss protection as well. Other herbal products that are commonly used during menopause lack enough evidence to support their use as a replacement for conventional menopause therapies. These products include evening primrose, ginseng, and red clover. What are the cases when herbal products should not be used during menopause? Do not use herbal products during menopause without your health care provider's approval if:  You are taking medicine.  You have a preexisting liver condition. Are there any risks in my taking herbal products during menopause? If you choose to use herbal products to help with symptoms of menopause, keep in mind that:  Different supplements have different and unmeasured amounts of herbal ingredients.  Herbal products are not regulated the same way that medicines are.  Concentrations of herbs may vary depending on the way they are prepared. For example, the concentration may be different in a pill, tea, oil, and tincture.    Little is known about the risks of using herbal products, particularly the risks of long-term use.  Some herbal  supplements can be harmful when combined with certain medicines. Most commonly reported side effects of herbal products are mild. However, if used improperly, many herbal supplements can cause serious problems. Talk to your health care provider before starting any herbal product. If problems develop, stop taking the supplement and let your health care provider know. This information is not intended to replace advice given to you by your health care provider. Make sure you discuss any questions you have with your health care provider. Document Released: 07/30/2007 Document Revised: 01/08/2016 Document Reviewed: 07/26/2013 Elsevier Interactive Patient Education  2017 Elsevier Inc. Menopause Menopause is the normal time of life when menstrual periods stop completely. Menopause is complete when you have missed 12 consecutive menstrual periods. It usually occurs between the ages of 48 years and 55 years. Very rarely does a woman develop menopause before the age of 40 years. At menopause, your ovaries stop producing the female hormones estrogen and progesterone. This can cause undesirable symptoms and also affect your health. Sometimes the symptoms may occur 4-5 years before the menopause begins. There is no relationship between menopause and:  Oral contraceptives.  Number of children you had.  Race.  The age your menstrual periods started (menarche). Heavy smokers and very thin women may develop menopause earlier in life. What are the causes?  The ovaries stop producing the female hormones estrogen and progesterone. Other causes include:  Surgery to remove both ovaries.  The ovaries stop functioning for no known reason.  Tumors of the pituitary gland in the brain.  Medical disease that affects the ovaries and hormone production.  Radiation treatment to the abdomen or pelvis.  Chemotherapy that affects the ovaries. What are the signs or symptoms?  Hot flashes.  Night  sweats.  Decrease in sex drive.  Vaginal dryness and thinning of the vagina causing painful intercourse.  Dryness of the skin and developing wrinkles.  Headaches.  Tiredness.  Irritability.  Memory problems.  Weight gain.  Bladder infections.  Hair growth of the face and chest.  Infertility. More serious symptoms include:  Loss of bone (osteoporosis) causing breaks (fractures).  Depression.  Hardening and narrowing of the arteries (atherosclerosis) causing heart attacks and strokes. How is this diagnosed?  When the menstrual periods have stopped for 12 straight months.  Physical exam.  Hormone studies of the blood. How is this treated? There are many treatment choices and nearly as many questions about them. The decisions to treat or not to treat menopausal changes is an individual choice made with your health care provider. Your health care provider can discuss the treatments with you. Together, you can decide which treatment will work best for you. Your treatment choices may include:  Hormone therapy (estrogen and progesterone).  Non-hormonal medicines.  Treating the individual symptoms with medicine (for example antidepressants for depression).  Herbal medicines that may help specific symptoms.  Counseling by a psychiatrist or psychologist.  Group therapy.  Lifestyle changes including:  Eating healthy.  Regular exercise.  Limiting caffeine and alcohol.  Stress management and meditation.  No treatment. Follow these instructions at home:  Take the medicine your health care provider gives you as directed.  Get plenty of sleep and rest.  Exercise regularly.  Eat a diet that contains calcium (good for the bones) and soy products (acts like estrogen hormone).  Avoid alcoholic beverages.  Do not smoke.  If you   have hot flashes, dress in layers.  Take supplements, calcium, and vitamin D to strengthen bones.  You can use over-the-counter  lubricants or moisturizers for vaginal dryness.  Group therapy is sometimes very helpful.  Acupuncture may be helpful in some cases. Contact a health care provider if:  You are not sure you are in menopause.  You are having menopausal symptoms and need advice and treatment.  You are still having menstrual periods after age 55 years.  You have pain with intercourse.  Menopause is complete (no menstrual period for 12 months) and you develop vaginal bleeding.  You need a referral to a specialist (gynecologist, psychiatrist, or psychologist) for treatment. Get help right away if:  You have severe depression.  You have excessive vaginal bleeding.  You fell and think you have a broken bone.  You have pain when you urinate.  You develop leg or chest pain.  You have a fast pounding heart beat (palpitations).  You have severe headaches.  You develop vision problems.  You feel a lump in your breast.  You have abdominal pain or severe indigestion. This information is not intended to replace advice given to you by your health care provider. Make sure you discuss any questions you have with your health care provider. Document Released: 05/03/2003 Document Revised: 07/19/2015 Document Reviewed: 09/09/2012 Elsevier Interactive Patient Education  2017 Elsevier Inc.  

## 2017-10-20 ENCOUNTER — Encounter: Payer: Self-pay | Admitting: Physician Assistant

## 2017-10-20 DIAGNOSIS — R232 Flushing: Secondary | ICD-10-CM | POA: Insufficient documentation

## 2017-10-20 DIAGNOSIS — L301 Dyshidrosis [pompholyx]: Secondary | ICD-10-CM | POA: Insufficient documentation

## 2017-10-20 NOTE — Progress Notes (Signed)
Patient was approved for Eucrisa cream from 10/19/2017 through 01/20/2015. Form sent to scan.

## 2017-11-20 ENCOUNTER — Ambulatory Visit (INDEPENDENT_AMBULATORY_CARE_PROVIDER_SITE_OTHER): Payer: BC Managed Care – PPO

## 2017-11-20 DIAGNOSIS — Z1231 Encounter for screening mammogram for malignant neoplasm of breast: Secondary | ICD-10-CM | POA: Diagnosis not present

## 2017-11-21 NOTE — Progress Notes (Signed)
Call pt: normal mammogram. Follow up in one year.

## 2018-01-10 ENCOUNTER — Other Ambulatory Visit: Payer: Self-pay | Admitting: Physician Assistant

## 2018-01-10 DIAGNOSIS — E663 Overweight: Secondary | ICD-10-CM

## 2018-01-19 ENCOUNTER — Ambulatory Visit: Payer: BC Managed Care – PPO | Admitting: Physician Assistant

## 2018-01-19 ENCOUNTER — Encounter: Payer: Self-pay | Admitting: Physician Assistant

## 2018-01-19 DIAGNOSIS — E6609 Other obesity due to excess calories: Secondary | ICD-10-CM

## 2018-01-19 DIAGNOSIS — Z6830 Body mass index (BMI) 30.0-30.9, adult: Secondary | ICD-10-CM

## 2018-01-19 MED ORDER — PHENTERMINE HCL 15 MG PO CAPS: 15.0000 mg | ORAL_CAPSULE | ORAL | 0 refills | Status: DC

## 2018-01-19 MED ORDER — TOPIRAMATE ER 50 MG PO CAP24: 1.0000 | ORAL_CAPSULE | Freq: Every day | ORAL | 0 refills | Status: DC

## 2018-01-19 NOTE — Progress Notes (Signed)
   Subjective:    Patient ID: Renee PicklesMelanie Hopkins, female    DOB: 12/21/1965, 52 y.o.   MRN: [id number]  HPI  Patient is a 52 year old female who presents to the clinic to discuss weight.  She is on phentermine and Topamax and has not lost any weight in the last 3 months.  She admits she is not taking the Topamax as directed because she forgets frequently.  She has been taking the phentermine.  She does feel like it curbs her appetite but she is not losing weight.  She admits she is not exercising but her husband did just have her read joined the [employer].  Patient has been called about scheduling her colonoscopy has not done so yet.  .. Active Ambulatory Problems    Diagnosis Date Noted  . Class 1 obesity due to excess calories without serious comorbidity with body mass index (BMI) of 31.0 to 31.9 in adult 04/24/2014  . Abnormal weight gain 04/24/2014  . Herpes simplex 05/14/2014  . Intertrigo 06/01/2014  . Overweight (BMI 25.0-29.9) 10/23/2014  . Cervical radiculopathy at C7 04/16/2015  . Overweight 05/07/2015  . Left lumbar radiculopathy 11/09/2015  . Felon of finger 11/21/2015  . Thumb pain 11/21/2015  . Craving for particular food 02/19/2016  . Acrochordon 07/25/2016  . Excessive flatus 09/23/2016  . Pain of upper abdomen 09/23/2016  . Rhinitis, non-allergic 05/21/2017  . Hot flashes 10/20/2017  . Eczema, dyshidrotic 10/20/2017   Resolved Ambulatory Problems    Diagnosis Date Noted  . Muscle spasm of back 04/16/2015  . Muscle strain of left upper back 04/16/2015  . 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 Arthritis       Review of Systems See HPI.     Objective:   Physical Exam  Constitutional: She is oriented to person, place, and time. She appears well-developed and well-nourished.  HENT:  Head: Normocephalic and atraumatic.  Cardiovascular: Normal rate and regular rhythm.  Pulmonary/Chest:  Effort normal and breath sounds normal.  Neurological: She is alert and oriented to person, place, and time.  Psychiatric: She has a normal mood and affect. Her behavior is normal.          Assessment & Plan:  [name].[name].Renee Hopkins was seen today for medication management.  Diagnoses and all orders for this visit:  Class 1 obesity due to excess calories without serious comorbidity with body mass index (BMI) of 30.0 to 30.9 in adult -     Topiramate ER (TROKENDI XR) 50 MG CP24; Take 1 tablet by mouth daily. -     phentermine 15 MG capsule; Take 1 capsule (15 mg total) by mouth every morning.   Will do another 3 months of phentermine and switch IR to ER topamax. Discussed exercise and diet changes.  Discussed switching to saxenda. Pt will check with insurance.

## 2018-01-19 NOTE — Patient Instructions (Signed)
saxenda    Liraglutide injection (Weight Management) What is this medicine? LIRAGLUTIDE (LIR a GLOO tide) is used with a reduced calorie diet and exercise to help you lose weight. This medicine may be used for other purposes; ask your health care provider or pharmacist if you have questions. COMMON BRAND NAME(S): Saxenda What should I tell my health care provider before I take this medicine? They need to know if you have any of these conditions: -endocrine tumors (MEN 2) or if someone in your family had these tumors -gallbladder disease -high cholesterol -history of alcohol abuse problem -history of pancreatitis -kidney disease or if you are on dialysis -liver disease -previous swelling of the tongue, face, or lips with difficulty breathing, difficulty swallowing, hoarseness, or tightening of the throat -stomach problems -suicidal thoughts, plans, or attempt; a previous suicide attempt by you or a family member -thyroid cancer or if someone in your family had thyroid cancer -an unusual or allergic reaction to liraglutide, other medicines, foods, dyes, or preservatives -pregnant or trying to get pregnant -breast-feeding How should I use this medicine? This medicine is for injection under the skin of your upper leg, stomach area, or upper arm. You will be taught how to prepare and give this medicine. Use exactly as directed. Take your medicine at regular intervals. Do not take it more often than directed. It is important that you put your used needles and syringes in a special sharps container. Do not put them in a trash can. If you do not have a sharps container, call your pharmacist or healthcare provider to get one. A special MedGuide will be given to you by the pharmacist with each prescription and refill. Be sure to read this information carefully each time. Talk to your pediatrician regarding the use of this medicine in children. Special care may be needed. Overdosage: If you think  you have taken too much of this medicine contact a poison control center or emergency room at once. NOTE: This medicine is only for you. Do not share this medicine with others. What if I miss a dose? If you miss a dose, take it as soon as you can. If it is almost time for your next dose, take only that dose. Do not take double or extra doses. If you miss your dose for 3 days or more, call your doctor or health care professional to talk about how to restart this medicine. What may interact with this medicine? -insulin and other medicines for diabetes This list may not describe all possible interactions. Give your health care provider a list of all the medicines, herbs, non-prescription drugs, or dietary supplements you use. Also tell them if you smoke, drink alcohol, or use illegal drugs. Some items may interact with your medicine. What should I watch for while using this medicine? Visit your doctor or health care professional for regular checks on your progress. This medicine is intended to be used in addition to a healthy diet and appropriate exercise. The best results are achieved this way. Do not increase or in any way change your dose without consulting your doctor or health care professional. Drink plenty of fluids while taking this medicine. Check with your doctor or health care professional if you get an attack of severe diarrhea, nausea, and vomiting. The loss of too much body fluid can make it dangerous for you to take this medicine. This medicine may affect blood sugar levels. If you have diabetes, check with your doctor or health care professional before  you change your diet or the dose of your diabetic medicine. Patients and their families should watch out for worsening depression or thoughts of suicide. Also watch out for sudden changes in feelings such as feeling anxious, agitated, panicky, irritable, hostile, aggressive, impulsive, severely restless, overly excited and hyperactive, or not  being able to sleep. If this happens, especially at the beginning of treatment or after a change in dose, call your health care professional. What side effects may I notice from receiving this medicine? Side effects that you should report to your doctor or health care professional as soon as possible: -allergic reactions like skin rash, itching or hives, swelling of the face, lips, or tongue -breathing problems -diarrhea that continues or is severe -lump or swelling on the neck -severe nausea -signs and symptoms of infection like fever or chills; cough; sore throat; pain or trouble passing urine -signs and symptoms of low blood sugar such as feeling anxious, confusion, dizziness, increased hunger, unusually weak or tired, sweating, shakiness, cold, irritable, headache, blurred vision, fast heartbeat, loss of consciousness -signs and symptoms of kidney injury like trouble passing urine or change in the amount of urine -trouble swallowing -unusual stomach upset or pain -vomiting Side effects that usually do not require medical attention (report to your doctor or health care professional if they continue or are bothersome): -constipation -decreased appetite -diarrhea -fatigue -headache -nausea -pain, redness, or irritation at site where injected -stomach upset -stuffy or runny nose This list may not describe all possible side effects. Call your doctor for medical advice about side effects. You may report side effects to FDA at 1-800-FDA-1088. Where should I keep my medicine? Keep out of the reach of children. Store unopened pen in a refrigerator between 2 and 8 degrees C (36 and 46 degrees F). Do not freeze or use if the medicine has been frozen. Protect from light and excessive heat. After you first use the pen, it can be stored at room temperature between 15 and 30 degrees C (59 and 86 degrees F) or in a refrigerator. Throw away your used pen after 30 days or after the expiration date,  whichever comes first. Do not store your pen with the needle attached. If the needle is left on, medicine may leak from the pen. NOTE: This sheet is a summary. It may not cover all possible information. If you have questions about this medicine, talk to your doctor, pharmacist, or health care provider.  2018 Elsevier/Gold Standard (2016-02-28 14:41:37)

## 2018-04-21 ENCOUNTER — Encounter: Payer: Self-pay | Admitting: Physician Assistant

## 2018-04-21 ENCOUNTER — Ambulatory Visit: Payer: BC Managed Care – PPO | Admitting: Physician Assistant

## 2018-04-21 VITALS — BP 108/70 | HR 75 | Temp 98.4°F | Wt 207.0 lb

## 2018-04-21 DIAGNOSIS — R05 Cough: Secondary | ICD-10-CM | POA: Diagnosis not present

## 2018-04-21 DIAGNOSIS — E663 Overweight: Secondary | ICD-10-CM

## 2018-04-21 DIAGNOSIS — R059 Cough, unspecified: Secondary | ICD-10-CM

## 2018-04-21 DIAGNOSIS — J069 Acute upper respiratory infection, unspecified: Secondary | ICD-10-CM

## 2018-04-21 MED ORDER — METHYLPREDNISOLONE 4 MG PO TBPK: ORAL_TABLET | ORAL | 0 refills | Status: DC

## 2018-04-21 MED ORDER — PHENTERMINE HCL 15 MG PO CAPS: 15.0000 mg | ORAL_CAPSULE | ORAL | 0 refills | Status: DC

## 2018-04-21 MED ORDER — HYDROCODONE-HOMATROPINE 5-1.5 MG/5ML PO SYRP: 5.0000 mL | ORAL_SOLUTION | Freq: Two times a day (BID) | ORAL | 0 refills | Status: DC

## 2018-04-21 MED ORDER — TOPIRAMATE ER 50 MG PO CAP24: 1.0000 | ORAL_CAPSULE | Freq: Every day | ORAL | 0 refills | Status: DC

## 2018-04-21 NOTE — Progress Notes (Signed)
Subjective:    Patient ID: Renee Hopkins, female    DOB: [birth year], 53 y.o.   MRN: [id number]  HPI  Pt is a 53 yo female who presents to the clinic to follow up on weight loss.   She is is on phentermine and topamax. She is doing great. She had to go off of it for 2 months because of extensive dental work and infections. She is down 2lbs. No side effects. She plans to start exercising more.   She does c/o of nasal and sinus pressure for a little over a week. She has not been taking anything OTC. She denies any fever, chills, SOB, wheezing. Her cough is the most bothersome but dry. It does keep her up at night. She request a cough syrup. She finished abx about 2-3 weeks ago for tooth infection.   .. Active Ambulatory Problems    Diagnosis Date Noted  . Class 1 obesity due to excess calories without serious comorbidity with body mass index (BMI) of 31.0 to 31.9 in adult 04/24/2014  . Abnormal weight gain 04/24/2014  . Herpes simplex 05/14/2014  . Intertrigo 06/01/2014  . Overweight (BMI 25.0-29.9) 10/23/2014  . Cervical radiculopathy at C7 04/16/2015  . Overweight 05/07/2015  . Left lumbar radiculopathy 11/09/2015  . Felon of finger 11/21/2015  . Thumb pain 11/21/2015  . Craving for particular food 02/19/2016  . Acrochordon 07/25/2016  . Excessive flatus 09/23/2016  . Pain of upper abdomen 09/23/2016  . Rhinitis, non-allergic 05/21/2017  . Hot flashes 10/20/2017  . Eczema, dyshidrotic 10/20/2017   Resolved Ambulatory Problems    Diagnosis Date Noted  . Muscle spasm of back 04/16/2015  . Muscle strain of left upper back 04/16/2015  . 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 Arthritis      Review of Systems See HPI.     Objective:   Physical Exam Vitals signs reviewed.  Constitutional:      Appearance: Normal appearance.  HENT:     Head: Normocephalic and atraumatic.     Right Ear: Tympanic  membrane and ear canal normal.     Left Ear: Tympanic membrane and ear canal normal.     Nose: Congestion present.     Mouth/Throat:     Pharynx: Oropharynx is clear.  Eyes:     Conjunctiva/sclera: Conjunctivae normal.  Cardiovascular:     Rate and Rhythm: Normal rate and regular rhythm.  Pulmonary:     Effort: Pulmonary effort is normal.     Breath sounds: Normal breath sounds.  Skin:    Findings: No rash.  Neurological:     General: No focal deficit present.     Mental Status: She is alert and oriented to person, place, and time.  Psychiatric:        Mood and Affect: Mood normal.           Assessment & Plan:  Marland KitchenMarland KitchenDiagnoses and all orders for this visit:  Viral upper respiratory tract infection -     methylPREDNISolone (MEDROL DOSEPAK) 4 MG TBPK tablet; Take as directed by package insert.  Overweight (BMI 25.0-29.9) -     phentermine 15 MG capsule; Take 1 capsule (15 mg total) by mouth every morning. -     Topiramate ER (TROKENDI XR) 50 MG CP24; Take 1 tablet by mouth daily.  Cough -     methylPREDNISolone (MEDROL DOSEPAK) 4 MG TBPK tablet; Take as directed by package insert. -  HYDROcodone-homatropine (HYCODAN) 5-1.5 MG/5ML syrup; Take 5 mLs by mouth every 12 (twelve) hours.   Refilled weight loss medications. Her BMI is now in overweight category. Follow up in 3 months. Continue to make diet and exercise changes.   I do think upper respiratory symptoms are viral or allergic. She recently completed abx for tooth infection. I would like patient to try flonase and zyrtec daily. Medrol dose pak given too. If not improving could consider abx. Consider sinus rinses.small quanity of cough syrup given for bedtime.

## 2018-04-23 ENCOUNTER — Encounter: Payer: Self-pay | Admitting: Physician Assistant

## 2018-05-13 ENCOUNTER — Other Ambulatory Visit: Payer: Self-pay | Admitting: Physician Assistant

## 2018-05-13 DIAGNOSIS — B009 Herpesviral infection, unspecified: Secondary | ICD-10-CM

## 2018-06-07 ENCOUNTER — Other Ambulatory Visit: Payer: Self-pay | Admitting: Physician Assistant

## 2018-06-07 DIAGNOSIS — B009 Herpesviral infection, unspecified: Secondary | ICD-10-CM

## 2018-06-14 IMAGING — CR DG CERVICAL SPINE 1V
1 series · 1 of 1 positions shown · non-contrast
Comparison: Cervical spine MRI 05/07/2015.

CLINICAL DATA: 49-year-old female undergoing cervical spine
surgery. Initial encounter.

EXAM:
CERVICAL SPINE 1 VIEW

[lat]
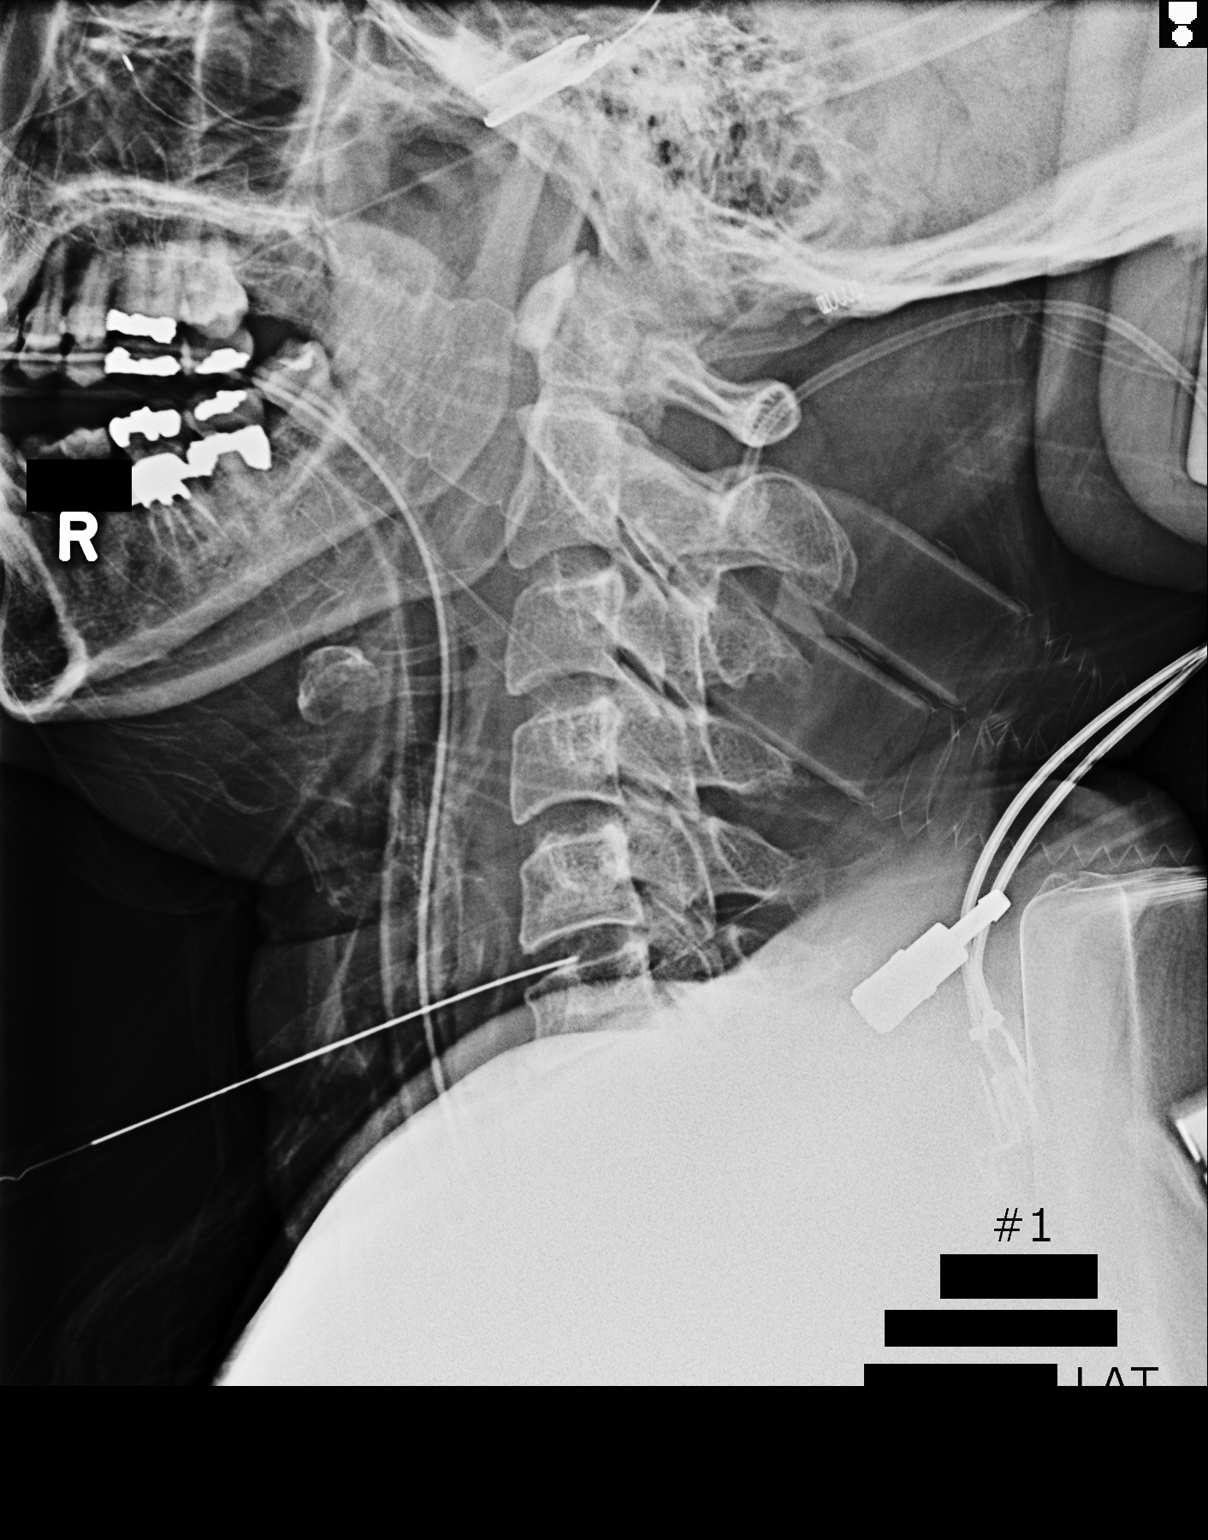

[1 of 1 positions shown; findings below may reference images not displayed]

FINDINGS: Intraoperative portable cross-table lateral view of the cervical
spine labeled #1 at 0500 hours. Surgical probe projects over the
C5-C6 disc space.
IMPRESSION: Intraoperative localization at C5-C6.

## 2018-06-14 IMAGING — RF DG C-ARM 61-120 MIN
1 series · 1 of 1 positions shown · non-contrast
Comparison: Intraoperative radiographs earlier same date. Cervical
MRI 05/07/2015.

CLINICAL DATA: C5-6 and C6-7 disc arthroplasty.

EXAM:
DG C-ARM 61-120 MIN; DG CERVICAL SPINE - 1 VIEW

[Series 1: run · 1 of 1 slices shown]
[im 1/1]
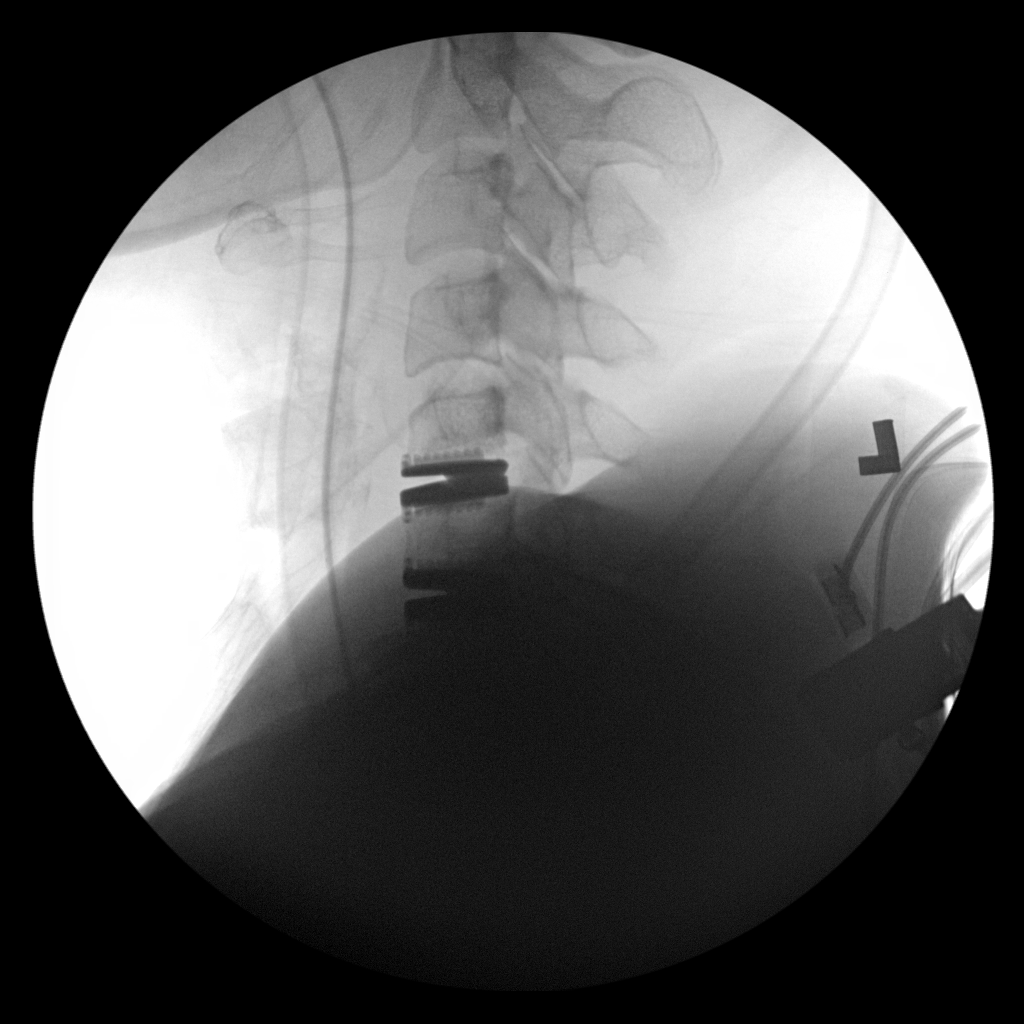

[1 of 1 positions shown; findings below may reference images not displayed]

FINDINGS: has undergone disc arthroplasty at C5-6 and C6-7. The hardware is
well positioned. No complications are identified.
IMPRESSION: No demonstrated complication following C5-7 disc arthroplasty.

## 2018-07-03 ENCOUNTER — Other Ambulatory Visit: Payer: Self-pay | Admitting: Sports Medicine

## 2018-07-03 DIAGNOSIS — J310 Chronic rhinitis: Secondary | ICD-10-CM

## 2018-07-04 NOTE — Telephone Encounter (Signed)
Haven't seen her in over a year, appt needed.

## 2018-07-05 NOTE — Telephone Encounter (Signed)
Please contact to schedule

## 2018-07-08 ENCOUNTER — Encounter: Payer: Self-pay | Admitting: Sports Medicine

## 2018-07-08 ENCOUNTER — Telehealth (INDEPENDENT_AMBULATORY_CARE_PROVIDER_SITE_OTHER): Payer: BC Managed Care – PPO | Admitting: Sports Medicine

## 2018-07-08 DIAGNOSIS — H1013 Acute atopic conjunctivitis, bilateral: Secondary | ICD-10-CM

## 2018-07-08 DIAGNOSIS — H1010 Acute atopic conjunctivitis, unspecified eye: Secondary | ICD-10-CM | POA: Insufficient documentation

## 2018-07-08 MED ORDER — AZELASTINE HCL 0.05 % OP SOLN: 2.0000 [drp] | Freq: Two times a day (BID) | OPHTHALMIC | 11 refills | Status: DC

## 2018-07-08 MED ORDER — FEXOFENADINE HCL 180 MG PO TABS: 180.0000 mg | ORAL_TABLET | Freq: Every day | ORAL | 3 refills | Status: DC

## 2018-07-08 NOTE — Progress Notes (Signed)
Virtual Visit via WebEx/MyChart   I connected with  Renee Hopkins  on 07/08/18 via WebEx/MyChart/Doximity Video and verified that I am speaking with the correct person using two identifiers.   I discussed the limitations, risks, security and privacy concerns of performing an evaluation and management service by WebEx/MyChart/Doximity Video, including the higher likelihood of inaccurate diagnosis and treatment, and the availability of in person appointments.  We also discussed the likely need of an additional face to face encounter for complete and high quality delivery of care.  I also discussed with the patient that there may be a patient responsible charge related to this service. The patient expressed understanding and wishes to proceed.  Provider location is either at home or medical facility. Patient location is at their home, different from provider location. People involved in care of the patient during this telehealth encounter were myself, my nurse/medical assistant, and my front office/scheduling team member.  Subjective:    CC: Itchy eyes  HPI: This is a pleasant 53 year old female, every year about this time she gets itchy, watery, heavy feeling eyes.  She does not really have any nasal discharge, no cough, no shortness of breath, no wheezing.  No fevers, chills, eyes are painful, they are simply itchy.  No overt visual changes.  No conjunctival injection.  Symptoms are moderate, persistent.  I reviewed the past medical history, family history, social history, surgical history, and allergies today and no changes were needed.  Please see the problem list section below in epic for further details.  Past Medical History: Past Medical History:  Diagnosis Date  . Arthritis    back   Past Surgical History: Past Surgical History:  Procedure Laterality Date  . CERVICAL DISC ARTHROPLASTY N/A 08/08/2015   Procedure: Cervical five-six Cervical six-seven  Anterior cervical  Arthroplasty;  Surgeon: Coletta Memos, MD;  Location: [facility];  Service: Neurosurgery;  Laterality: N/A;  . CYSTECTOMY Right    shoulder  . SHOULDER SURGERY     Social History: Social History   Socioeconomic History  . Marital status: Married    Spouse name: Not on file  . Number of children: Not on file  . Years of education: Not on file  . Highest education level: Not on file  Occupational History  . Not on file  Social Needs  . Financial resource strain: Not on file  . Food insecurity:    Worry: Not on file    Inability: Not on file  . Transportation needs:    Medical: Not on file    Non-medical: Not on file  Tobacco Use  . Smoking status: Never Smoker  . Smokeless tobacco: Never Used  Substance and Sexual Activity  . Alcohol use: Yes    Alcohol/week: 0.0 standard drinks    Comment: rarely  . Drug use: No  . Sexual activity: Yes    Partners: Male  Lifestyle  . Physical activity:    Days per week: Not on file    Minutes per session: Not on file  . Stress: Not on file  Relationships  . Social connections:    Talks on phone: Not on file    Gets together: Not on file    Attends religious service: Not on file    Active member of club or organization: Not on file    Attends meetings of clubs or organizations: Not on file    Relationship status: Not on file  Other Topics Concern  . Not on file  Social History Narrative  . Not on file   Family History: Family History  Problem Relation Age of Onset  . Diabetes Father   . Hypertension Father   . Hypertension Brother   . Stroke Maternal Grandmother    Allergies: Allergies  Allergen Reactions  . Oxycodone-Acetaminophen Nausea And Vomiting   Medications: See med rec.  Review of Systems: No fevers, chills, night sweats, weight loss, chest pain, or shortness of breath.   Objective:    General: Speaking full sentences, no audible heavy breathing.  Sounds alert and appropriately interactive.  Appears well.   Face symmetric.  Extraocular movements intact.  Pupils equal and round.  No nasal flaring or accessory muscle use visualized.  No other physical exam performed due to the non-physical nature of this visit.  Impression and Recommendations:    Allergic conjunctivitis Adding Allegra and Optivar. We can revisit this as needed.  I discussed the above assessment and treatment plan with the patient. The patient was provided an opportunity to ask questions and all were answered. The patient agreed with the plan and demonstrated an understanding of the instructions.   The patient was advised to call back or seek an in-person evaluation if the symptoms worsen or if the condition fails to improve as anticipated.   I provided 25 minutes of non-face-to-face time during this encounter, 15 minutes of additional time was needed to gather information, review chart, records, communicate/coordinate with staff remotely, troubleshooting the multiple errors that we get every time when trying to do video calls through the electronic medical record, WebEx, and Doximity, restart the encounter multiple times due to instability of the software, as well as complete documentation.   ___________________________________________ Ihor Austinhomas J. Benjamin Stainhekkekandam, M.D., ABFM., CAQSM. Primary Care and Sports Medicine [facility]  Adjunct Professor of Family Medicine  [employer]

## 2018-07-08 NOTE — Assessment & Plan Note (Signed)
Adding Allegra and Optivar. We can revisit this as needed.

## 2018-09-27 IMAGING — DX DG FINGER THUMB 2+V*R*
3 series · 3 of 3 positions shown · non-contrast
Comparison: None.

CLINICAL DATA: Right thumb pain

EXAM:
RIGHT THUMB 2+V

[finger ap]
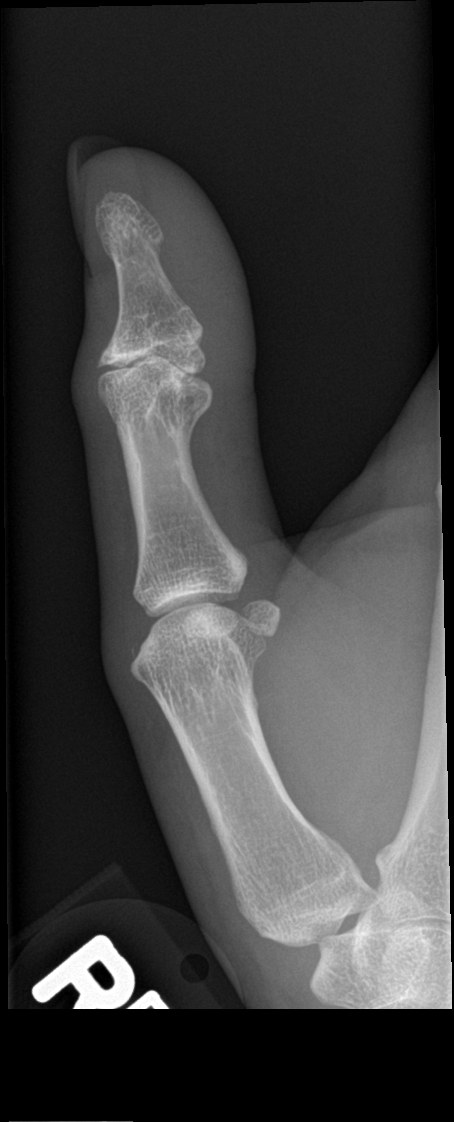

[finger obl]
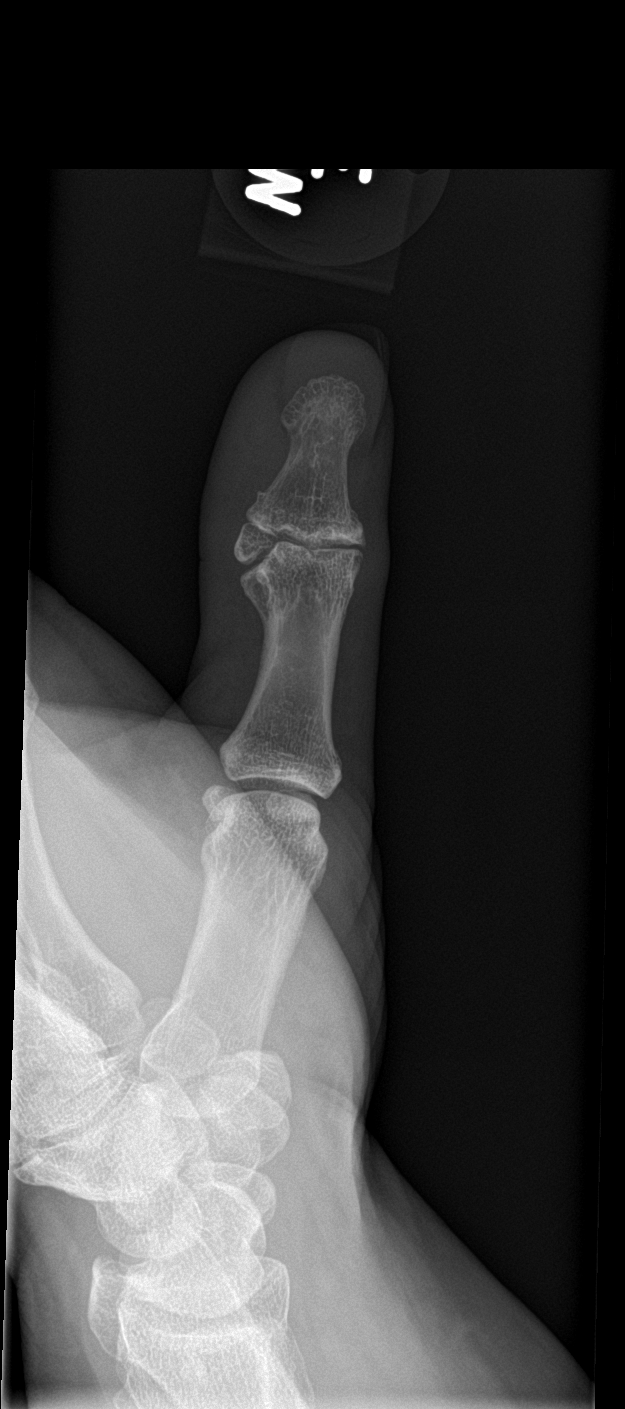

[finger lat]
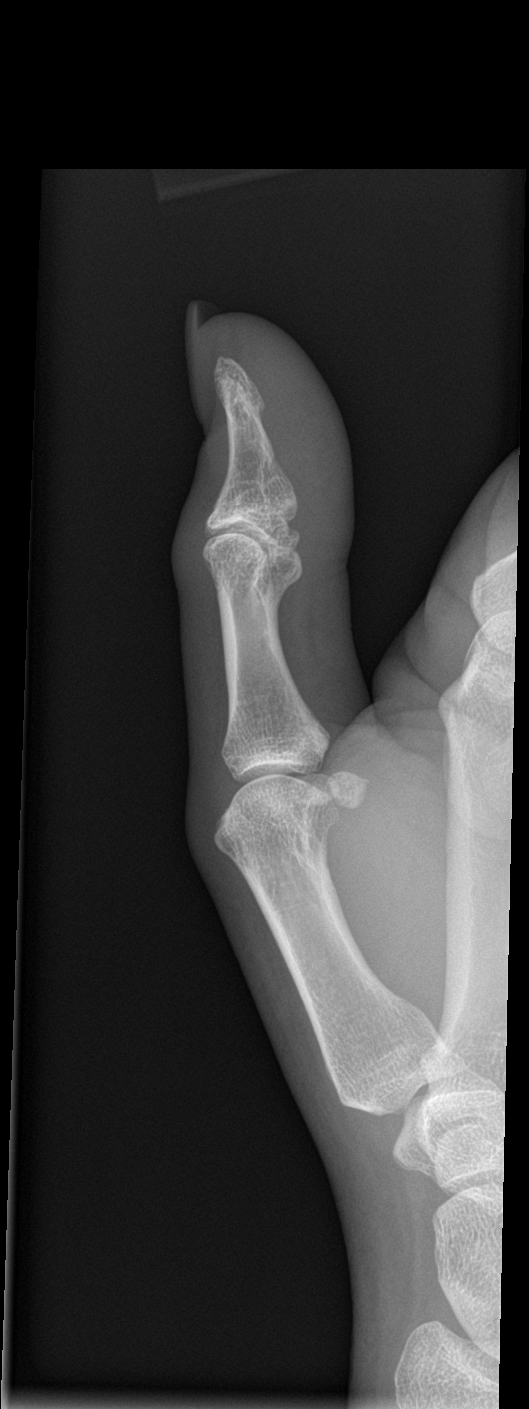

[3 of 3 positions shown; findings below may reference images not displayed]

FINDINGS: Three views of the right thumb submitted. No acute fracture or
subluxation degenerative changes are noted interphalangeal joint.
IMPRESSION: No acute fracture or subluxation. Degenerative changes
interphalangeal joint.

## 2018-12-24 ENCOUNTER — Other Ambulatory Visit: Payer: Self-pay | Admitting: Physician Assistant

## 2018-12-24 DIAGNOSIS — Z1231 Encounter for screening mammogram for malignant neoplasm of breast: Secondary | ICD-10-CM

## 2019-01-13 ENCOUNTER — Ambulatory Visit (INDEPENDENT_AMBULATORY_CARE_PROVIDER_SITE_OTHER): Payer: BC Managed Care – PPO

## 2019-01-13 ENCOUNTER — Other Ambulatory Visit: Payer: Self-pay

## 2019-01-13 DIAGNOSIS — Z1231 Encounter for screening mammogram for malignant neoplasm of breast: Secondary | ICD-10-CM | POA: Diagnosis not present

## 2019-01-17 NOTE — Progress Notes (Signed)
Normal mammogram. Follow up in 1 year.

## 2019-01-19 ENCOUNTER — Encounter: Payer: Self-pay | Admitting: Neurology

## 2019-02-15 ENCOUNTER — Ambulatory Visit (INDEPENDENT_AMBULATORY_CARE_PROVIDER_SITE_OTHER): Payer: BC Managed Care – PPO | Admitting: Physician Assistant

## 2019-02-15 ENCOUNTER — Encounter: Payer: Self-pay | Admitting: Physician Assistant

## 2019-02-15 VITALS — BP 112/79 | HR 85 | Temp 98.1°F | Ht 70.0 in | Wt 234.0 lb

## 2019-02-15 DIAGNOSIS — Z20828 Contact with and (suspected) exposure to other viral communicable diseases: Secondary | ICD-10-CM | POA: Diagnosis not present

## 2019-02-15 DIAGNOSIS — J0140 Acute pansinusitis, unspecified: Secondary | ICD-10-CM

## 2019-02-15 DIAGNOSIS — R438 Other disturbances of smell and taste: Secondary | ICD-10-CM | POA: Diagnosis not present

## 2019-02-15 DIAGNOSIS — R519 Headache, unspecified: Secondary | ICD-10-CM | POA: Diagnosis not present

## 2019-02-15 DIAGNOSIS — Z20822 Contact with and (suspected) exposure to covid-19: Secondary | ICD-10-CM

## 2019-02-15 MED ORDER — METHYLPREDNISOLONE 4 MG PO TBPK: ORAL_TABLET | ORAL | 0 refills | Status: DC

## 2019-02-15 MED ORDER — FLUTICASONE PROPIONATE 50 MCG/ACT NA SUSP: 2.0000 | Freq: Every day | NASAL | 6 refills | Status: DC

## 2019-02-15 MED ORDER — AMOXICILLIN-POT CLAVULANATE 875-125 MG PO TABS: 1.0000 | ORAL_TABLET | Freq: Two times a day (BID) | ORAL | 0 refills | Status: DC

## 2019-02-15 NOTE — Progress Notes (Deleted)
Since Saturday: Sinus pressure Drainage into throat Can't taste food Had Covid test yesterday  No cough, no headache, No GI issues (n/v, diarrhea, abd pain)

## 2019-02-15 NOTE — Progress Notes (Signed)
Patient ID: Renee Hopkins, female   DOB: 04-19-1965, 53 y.o.   MRN: [id number] .Renee KitchenVirtual Visit via Video Note  I connected with Leona Alen on 02/15/19 at  9:50 AM EST by a video enabled telemedicine application and verified that I am speaking with the correct person using two identifiers.  Location: Patient: home Provider: home   I discussed the limitations of evaluation and management by telemedicine and the availability of in person appointments. The patient expressed understanding and agreed to proceed.  History of Present Illness: Pt is a 53 yo female who calls into the clinc with 4 days of sinus pressure, congestion, headache, no taste, no smell, sinus drainage. All drainage is clear. No cough, no GI issues. No direct covid exposure. covid tested yesterday but no results. No SOB , chills, or body aches.   .. Active Ambulatory Problems    Diagnosis Date Noted  . Class 1 obesity due to excess calories without serious comorbidity with body mass index (BMI) of 31.0 to 31.9 in adult 04/24/2014  . Abnormal weight gain 04/24/2014  . Herpes simplex 05/14/2014  . Intertrigo 06/01/2014  . Overweight (BMI 25.0-29.9) 10/23/2014  . Cervical radiculopathy at C7 04/16/2015  . Overweight 05/07/2015  . Left lumbar radiculopathy 11/09/2015  . Felon of finger 11/21/2015  . Thumb pain 11/21/2015  . Craving for particular food 02/19/2016  . Acrochordon 07/25/2016  . Excessive flatus 09/23/2016  . Pain of upper abdomen 09/23/2016  . Rhinitis, non-allergic 05/21/2017  . Hot flashes 10/20/2017  . Eczema, dyshidrotic 10/20/2017  . Allergic conjunctivitis 07/08/2018   Resolved Ambulatory Problems    Diagnosis Date Noted  . Muscle spasm of back 04/16/2015  . Muscle strain of left upper back 04/16/2015  . 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 Arthritis    Reviewed med, allergies, problem list.      Observations/Objective: No acute distress. No cough.  No labored breathing.  Appears pale and ill feeling.   .. Today's Vitals   02/15/19 0853  BP: 112/79  Pulse: 85  Temp: 98.1 F (36.7 C)  TempSrc: Oral  Weight: 234 lb (106.1 kg)  Height: 5\' 10"  (1.778 m)   Body mass index is 33.58 kg/m.    Assessment and Plan: Renee KitchenMarland KitchenMayia was seen today for sinus problem.  Diagnoses and all orders for this visit:  Suspected COVID-19 virus infection -     fluticasone (FLONASE) 50 MCG/ACT nasal spray; Place 2 sprays into both nostrils daily. -     methylPREDNISolone (MEDROL DOSEPAK) 4 MG TBPK tablet; Take as directed by package insert.  Acute non-recurrent pansinusitis -     fluticasone (FLONASE) 50 MCG/ACT nasal spray; Place 2 sprays into both nostrils daily. -     methylPREDNISolone (MEDROL DOSEPAK) 4 MG TBPK tablet; Take as directed by package insert. -     amoxicillin-clavulanate (AUGMENTIN) 875-125 MG tablet; Take 1 tablet by mouth 2 (two) times daily.   Likely covid. Discussed symptomatic care. Self isolate after positive results 10 days from symptoms which would be 12/30 can go back to work. Suspect more viral sinusitis symptoms start with flonase, mucinex, tylenol, medrol dose pack. If not improving going on for 7 days or blowing out more green stuff add augmentin for 10 days. New SOB or breathing issues call office or go to ED.    Follow Up Instructions:    I discussed the assessment and treatment plan with the patient. The patient  was provided an opportunity to ask questions and all were answered. The patient agreed with the plan and demonstrated an understanding of the instructions.   The patient was advised to call back or seek an in-person evaluation if the symptoms worsen or if the condition fails to improve as anticipated.   Tandy Gaw, PA-C

## 2019-02-21 ENCOUNTER — Telehealth: Payer: Self-pay

## 2019-02-21 NOTE — Telephone Encounter (Signed)
Renee Hopkins called and reports vaginal itching and redness for a day. She states she gets yeast infections every time she takes prednisone. Denies fever, chills, sweats, discharge or pelvic pain. Please advise.

## 2019-02-22 MED ORDER — FLUCONAZOLE 150 MG PO TABS: 150.0000 mg | ORAL_TABLET | Freq: Once | ORAL | 0 refills | Status: AC

## 2019-02-22 NOTE — Telephone Encounter (Signed)
Left a message advising of the new prescription.  

## 2019-02-22 NOTE — Telephone Encounter (Signed)
Called patient. There was an answer on the phone, but unable to hear anyone speaking.

## 2019-02-22 NOTE — Telephone Encounter (Signed)
Sent diflucan

## 2019-02-22 NOTE — Telephone Encounter (Signed)
Patient called and left vm. Confirmed she received message and she will pick Diflucan. Will call with any other issues.

## 2019-04-07 ENCOUNTER — Other Ambulatory Visit: Payer: Self-pay | Admitting: Physician Assistant

## 2019-04-07 DIAGNOSIS — B009 Herpesviral infection, unspecified: Secondary | ICD-10-CM

## 2019-05-02 ENCOUNTER — Other Ambulatory Visit: Payer: Self-pay | Admitting: Physician Assistant

## 2019-05-02 DIAGNOSIS — B009 Herpesviral infection, unspecified: Secondary | ICD-10-CM

## 2019-06-13 LAB — HM COLONOSCOPY

## 2019-06-27 LAB — HM PAP SMEAR: HM Pap smear: NORMAL

## 2019-07-11 ENCOUNTER — Encounter: Payer: Self-pay | Admitting: Physician Assistant

## 2020-01-02 ENCOUNTER — Other Ambulatory Visit: Payer: Self-pay | Admitting: Physician Assistant

## 2020-01-02 DIAGNOSIS — Z1231 Encounter for screening mammogram for malignant neoplasm of breast: Secondary | ICD-10-CM

## 2020-02-29 ENCOUNTER — Ambulatory Visit (INDEPENDENT_AMBULATORY_CARE_PROVIDER_SITE_OTHER): Payer: BC Managed Care – PPO

## 2020-02-29 ENCOUNTER — Other Ambulatory Visit: Payer: Self-pay

## 2020-02-29 DIAGNOSIS — Z1231 Encounter for screening mammogram for malignant neoplasm of breast: Secondary | ICD-10-CM

## 2020-03-01 NOTE — Progress Notes (Signed)
Normal mammogram. Follow up in 1 year.

## 2021-04-16 ENCOUNTER — Other Ambulatory Visit: Payer: Self-pay | Admitting: Physician Assistant

## 2021-04-16 DIAGNOSIS — Z1231 Encounter for screening mammogram for malignant neoplasm of breast: Secondary | ICD-10-CM

## 2021-05-01 ENCOUNTER — Other Ambulatory Visit: Payer: Self-pay

## 2021-05-01 ENCOUNTER — Ambulatory Visit (INDEPENDENT_AMBULATORY_CARE_PROVIDER_SITE_OTHER): Payer: BC Managed Care – PPO

## 2021-05-01 DIAGNOSIS — Z1231 Encounter for screening mammogram for malignant neoplasm of breast: Secondary | ICD-10-CM

## 2021-05-02 NOTE — Progress Notes (Signed)
Normal mammogram. Follow up in 1 year.

## 2021-05-24 ENCOUNTER — Emergency Department
Admission: EM | Admit: 2021-05-24 | Discharge: 2021-05-24 | Disposition: A | Discharge status: Home / Self Care | Payer: BC Managed Care – PPO | Source: Home / Self Care

## 2021-05-24 DIAGNOSIS — J029 Acute pharyngitis, unspecified: Secondary | ICD-10-CM

## 2021-05-24 DIAGNOSIS — J069 Acute upper respiratory infection, unspecified: Secondary | ICD-10-CM

## 2021-05-24 LAB — POCT RAPID STREP A (OFFICE): Rapid Strep A Screen: NEGATIVE

## 2021-05-24 NOTE — ED Triage Notes (Signed)
Pt states that she has a sore throat, coughing and some nasal congestion. X3 days ? ?Pt states that she is vaccinated for covid.  ?Pt states that she hasn't had flu vaccine.  ?

## 2021-05-24 NOTE — Discharge Instructions (Signed)
Take plain guaifenesin (1200mg  extended release tabs such as Mucinex) twice daily, with plenty of water, for cough and congestion.  May add Pseudoephedrine (30mg , one or two every 4 to 6 hours) for sinus congestion.  Get adequate rest.   ?May use Afrin nasal spray (or generic oxymetazoline) each morning for about 5 days and then discontinue.  Also recommend using saline nasal spray several times daily and saline nasal irrigation (AYR is a common brand).  Use Flonase nasal spray each morning after using Afrin nasal spray and saline nasal irrigation. ?Try warm salt water gargles for sore throat.  ?Stop all antihistamines for now (Benadryl, Nyquil, Allegra, etc) and other non-prescription cough/cold preparations. ?May take Ibuprofen 200mg , 4 tabs every 8 hours with food for sore throat, fever, headache, etc. ?May take Delsym Cough Suppressant ("12 Hour Cough Relief") at bedtime for nighttime cough.  ?  ?

## 2021-05-24 NOTE — ED Provider Notes (Signed)
?[facility] ? ? ? ?CSN: [id number] ?Arrival date & time: 05/24/21  0801 ? ? ?  ? ?History   ?Chief Complaint ?Chief Complaint  ?Patient presents with  ? Sore Throat  ?  Sore throat, cough and nasal congestion. x3days  ? ? ?HPI ?Renee Hopkins is a 56 y.o. female.  ? ?Three days ago patient developed a sore throat.  The next day she developed fatigue, nasal congestion, and a mild cough.  Yesterday her symptoms became worse.  Last night she developed myalgias and a sensation of fever while this morning she developed a headache. ? ?The history is provided by the patient.  ? ?Past Medical History:  ?Diagnosis Date  ? Arthritis   ? back  ? ? ?Patient Active Problem List  ? Diagnosis Date Noted  ? Allergic conjunctivitis 07/08/2018  ? Hot flashes 10/20/2017  ? Eczema, dyshidrotic 10/20/2017  ? Rhinitis, non-allergic 05/21/2017  ? Excessive flatus 09/23/2016  ? Pain of upper abdomen 09/23/2016  ? Acrochordon 07/25/2016  ? Craving for particular food 02/19/2016  ? Felon of finger 11/21/2015  ? Thumb pain 11/21/2015  ? Left lumbar radiculopathy 11/09/2015  ? Overweight 05/07/2015  ? Cervical radiculopathy at C7 04/16/2015  ? Overweight (BMI 25.0-29.9) 10/23/2014  ? Intertrigo 06/01/2014  ? Herpes simplex 05/14/2014  ? Class 1 obesity due to excess calories without serious comorbidity with body mass index (BMI) of 31.0 to 31.9 in adult 04/24/2014  ? Abnormal weight gain 04/24/2014  ? ? ?Past Surgical History:  ?Procedure Laterality Date  ? CERVICAL DISC ARTHROPLASTY N/A 08/08/2015  ? Procedure: Cervical five-six Cervical six-seven  Anterior cervical Arthroplasty;  Surgeon: Ashok Pall, MD;  Location: [facility];  Service: Neurosurgery;  Laterality: N/A;  ? CYSTECTOMY Right   ? shoulder  ? SHOULDER SURGERY    ? ? ?OB History   ?No obstetric history on file. ?  ? ? ? ?Home Medications   ? ?Prior to Admission medications   ?Medication Sig Start Date End Date Taking? Authorizing Provider  ?acetaminophen (TYLENOL) 325  MG tablet Take 650 mg by mouth as needed.    [provider]  ?amoxicillin-clavulanate (AUGMENTIN) 875-125 MG tablet Take 1 tablet by mouth 2 (two) times daily. 02/15/19   Breeback, Royetta Car, PA-C  ?azelastine (OPTIVAR) 0.05 % ophthalmic solution Place 2 drops into both eyes 2 (two) times daily. 07/08/18   Silverio Decamp, MD  ?fexofenadine (ALLEGRA) 180 MG tablet Take 1 tablet (180 mg total) by mouth daily. 07/08/18   Silverio Decamp, MD  ?fluticasone (FLONASE) 50 MCG/ACT nasal spray Place 2 sprays into both nostrils daily. 02/15/19   Breeback, Luvenia Starch L, PA-C  ?methylPREDNISolone (MEDROL DOSEPAK) 4 MG TBPK tablet Take as directed by package insert. 02/15/19   Breeback, Luvenia Starch L, PA-C  ?valACYclovir (VALTREX) 500 MG tablet TAKE 1 TABLET BY MOUTH TWICE A DAY FOR 3 DAYS WITH OUTBREAK 04/07/19   Breeback, Jade L, PA-C  ? ? ?Family History ?Family History  ?Problem Relation Age of Onset  ? Diabetes Father   ? Hypertension Father   ? Hypertension Brother   ? Stroke Maternal Grandmother   ? ? ?Social History ?Social History  ? ?Tobacco Use  ? Smoking status: Never  ? Smokeless tobacco: Never  ?Substance Use Topics  ? Alcohol use: Not Currently  ?  Comment: rarely  ? Drug use: No  ? ? ? ?Allergies   ?Oxycodone-acetaminophen ? ? ?Review of Systems ?Review of Systems ?+ sore throat ?+  cough ?No pleuritic pain ?No wheezing ?+ nasal congestion ?+ post-nasal drainage ?No sinus pain/pressure ?No itchy/red eyes ?No earache ?No hemoptysis ?No SOB ?? fever ?No nausea ?No vomiting ?No abdominal pain ?No diarrhea ?No urinary symptoms ?No skin rash ?+ fatigue ?+ myalgias ?+ headache ?Used OTC meds (Benadryl, Nyquil, Delsym) without relief  ? ?Physical Exam ?Triage Vital Signs ?ED Triage Vitals  ?Enc Vitals Group  ?   BP 05/24/21 0815 115/79  ?   Pulse Rate 05/24/21 0815 87  ?   Resp 05/24/21 0815 18  ?   Temp 05/24/21 0815 98.6 ?F (37 ?C)  ?   Temp Source 05/24/21 0815 Oral  ?   SpO2 05/24/21 0815 97 %  ?   Weight  05/24/21 0812 239 lb (108.4 kg)  ?   Height 05/24/21 0812 5\' 10"  (1.778 m)  ?   Head Circumference --   ?   Peak Flow --   ?   Pain Score 05/24/21 0812 6  ?   Pain Loc --   ?   Pain Edu? --   ?   Excl. in [place]? --   ? ?No data found. ? ?Updated Vital Signs ?BP 115/79 (BP Location: Right Arm)   Pulse 87   Temp 98.6 ?F (37 ?C) (Oral)   Resp 18   Ht 5\' 10"  (1.778 m)   Wt 108.4 kg   LMP 11/20/2017   SpO2 97%   BMI 34.29 kg/m?  ? ?Visual Acuity ?Right Eye Distance:   ?Left Eye Distance:   ?Bilateral Distance:   ? ?Right Eye Near:   ?Left Eye Near:    ?Bilateral Near:    ? ?Physical Exam ?Nursing notes and Vital Signs reviewed. ?Appearance:  Patient appears stated age, and in no acute distress ?Eyes:  Pupils are equal, round, and reactive to light and accomodation.  Extraocular movement is intact.  Conjunctivae are not inflamed  ?Ears:  Canals normal.  Tympanic membranes normal.  ?Nose:  Mildly congested turbinates.  No sinus tenderness.   ?Pharynx:  Normal ?Neck:  Supple.  Mildly enlarged lateral nodes are present. ?Lungs:  Clear to auscultation.  Breath sounds are equal.  Moving air well. ?Heart:  Regular rate and rhythm without murmurs, rubs, or gallops.  ?Abdomen:  Nontender without masses or hepatosplenomegaly.  Bowel sounds are present.  No CVA or flank tenderness.  ?Extremities:  No edema.  ?Skin:  No rash present.  ? ?UC Treatments / Results  ?Labs ?(all labs ordered are listed, but only abnormal results are displayed) ?Labs Reviewed  ?POCT RAPID STREP A (OFFICE) - Normal  ? ? ?EKG ? ? ?Radiology ?No results found. ? ?Procedures ?Procedures (including critical care time) ? ?Medications Ordered in UC ?Medications - No data to display ? ?Initial Impression / Assessment and Plan / UC Course  ?I have reviewed the triage vital signs and the nursing notes. ? ?Pertinent labs & imaging results that were available during my care of the patient were reviewed by me and considered in my medical decision making (see  chart for details). ? ?  ?Benign exam. There is no evidence of bacterial infection today.  Treat symptomatically for now  ?COVID19 PCR pending. ?Followup with Family Doctor if not improved in 7 to 10 days. ? ?Final Clinical Impressions(s) / UC Diagnoses  ? ?Final diagnoses:  ?Viral URI with cough  ? ? ? ?Discharge Instructions   ? ?  ?Take plain guaifenesin (1200mg  extended release tabs such as Mucinex) twice  daily, with plenty of water, for cough and congestion.  May add Pseudoephedrine (30mg , one or two every 4 to 6 hours) for sinus congestion.  Get adequate rest.   ?May use Afrin nasal spray (or generic oxymetazoline) each morning for about 5 days and then discontinue.  Also recommend using saline nasal spray several times daily and saline nasal irrigation (AYR is a common brand).  Use Flonase nasal spray each morning after using Afrin nasal spray and saline nasal irrigation. ?Try warm salt water gargles for sore throat.  ?Stop all antihistamines for now (Benadryl, Nyquil, Allegra, etc) and other non-prescription cough/cold preparations. ?May take Ibuprofen 200mg , 4 tabs every 8 hours with food for sore throat, fever, headache, etc. ?May take Delsym Cough Suppressant ("12 Hour Cough Relief") at bedtime for nighttime cough.  ?  ? ? ? ? ?ED Prescriptions   ?None ?  ? ? ?  ?Kandra Nicolas, MD ?05/26/21 1300 ? ?

## 2021-05-27 LAB — COVID-19, FLU A+B NAA
Influenza A, NAA: NOT DETECTED
Influenza B, NAA: NOT DETECTED
SARS-CoV-2, NAA: NOT DETECTED

## 2021-05-27 LAB — CULTURE, GROUP A STREP: Strep A Culture: NEGATIVE

## 2021-08-12 ENCOUNTER — Ambulatory Visit: Payer: BC Managed Care – PPO | Admitting: Physician Assistant

## 2021-08-12 ENCOUNTER — Encounter: Payer: Self-pay | Admitting: Physician Assistant

## 2021-08-12 VITALS — BP 119/69 | HR 96 | Ht 70.0 in | Wt 230.0 lb

## 2021-08-12 DIAGNOSIS — N6081 Other benign mammary dysplasias of right breast: Secondary | ICD-10-CM

## 2021-08-12 DIAGNOSIS — Z23 Encounter for immunization: Secondary | ICD-10-CM

## 2021-08-12 NOTE — Patient Instructions (Signed)
Epidermoid Cyst  An epidermoid cyst, also called an epidermal cyst, is a small lump under your skin. The cyst contains a substance called keratin. Do not try to pop or open the cyst yourself. What are the causes? A blocked hair follicle. A hair that curls and re-enters the skin instead of growing straight out of the skin. A blocked pore. Irritated skin. An injury to the skin. Certain conditions that are passed along from parent to child. Human papillomavirus (HPV). This happens rarely when cysts occur on the bottom of the feet. Long-term sun damage to the skin. What increases the risk? Having acne. Being female. Having an injury to the skin. Being past puberty. Having certain conditions caused by genes (genetic disorder) What are the signs or symptoms? These cysts are usually harmless, but they can get infected. Symptoms of infection may include: Redness. Inflammation. Tenderness. Warmth. Fever. A bad-smelling substance that drains from the cyst. Pus that drains from the cyst. How is this treated? In many cases, epidermoid cysts go away on their own without treatment. If a cyst becomes infected, treatment may include: Opening and draining the cyst, done by a doctor. After draining, you may need minor surgery to remove the rest of the cyst. Antibiotic medicine. Shots of medicines (steroids) that help to reduce inflammation. Surgery to remove the cyst. Surgery may be done if the cyst: Becomes large. Bothers you. Has a chance of turning into cancer. Do not try to open a cyst yourself. Follow these instructions at home: Medicines Take over-the-counter and prescription medicines as told by your doctor. If you were prescribed an antibiotic medicine, take it as told by your doctor. Do not stop taking it even if you start to feel better. General instructions Keep the area around your cyst clean and dry. Wear loose, dry clothing. Avoid touching your cyst. Check your cyst every day  for signs of infection. Check for: Redness, swelling, or pain. Fluid or blood. Warmth. Pus or a bad smell. Keep all follow-up visits. How is this prevented? Wear clean, dry, clothing. Avoid wearing tight clothing. Keep your skin clean and dry. Take showers or baths every day. Contact a doctor if: Your cyst has symptoms of infection. Your condition does not improve or gets worse. You have a cyst that looks different from other cysts you have had. You have a fever. Get help right away if: Redness spreads from the cyst into the area close by. Summary An epidermoid cyst is a small lump under your skin. If a cyst becomes infected, treatment may include surgery to open and drain the cyst, or to remove it. Take over-the-counter and prescription medicines only as told by your doctor. Contact a doctor if your condition is not improving or is getting worse. Keep all follow-up visits. This information is not intended to replace advice given to you by your health care provider. Make sure you discuss any questions you have with your health care provider. Document Revised: 05/18/2019 Document Reviewed: 05/18/2019 Elsevier Patient Education  2023 Elsevier Inc.  

## 2021-08-12 NOTE — Progress Notes (Unsigned)
   Acute Office Visit  Subjective:     Patient ID: Renee Hopkins, female    DOB: 1965/10/28, 56 y.o.   MRN: [id number]  Chief Complaint  Patient presents with   Breast Mass    HPI Patient is in today for right breast mass and concern. Pt noticed a red bump their about 2 weeks ago. She thought it was a bug bite. The redness has gone away but she still feels a mass there. No tenderness, swelling, nipple discharge. No personal hx of breast cancer but has had GI cancer and has her worried. No family hx of BC. Last mammogram was march 2023 and normal.   .. Family History  Problem Relation Age of Onset   Diabetes Father    Hypertension Father    Hypertension Brother    Stroke Maternal Grandmother      .[name] Active Ambulatory Problems    Diagnosis Date Noted   Class 1 obesity due to excess calories without serious comorbidity with body mass index (BMI) of 31.0 to 31.9 in adult 04/24/2014   Abnormal weight gain 04/24/2014   Herpes simplex 05/14/2014   Intertrigo 06/01/2014   Overweight (BMI 25.0-29.9) 10/23/2014   Cervical radiculopathy at C7 04/16/2015   Overweight 05/07/2015   Left lumbar radiculopathy 11/09/2015   Felon of finger 11/21/2015   Thumb pain 11/21/2015   Craving for particular food 02/19/2016   Acrochordon 07/25/2016   Excessive flatus 09/23/2016   Pain of upper abdomen 09/23/2016   Rhinitis, non-allergic 05/21/2017   Hot flashes 10/20/2017   Eczema, dyshidrotic 10/20/2017   Allergic conjunctivitis 07/08/2018   Sebaceous cyst of skin of right breast 08/12/2021   Resolved Ambulatory Problems    Diagnosis Date Noted   Muscle spasm of back 04/16/2015   Muscle strain of left upper back 04/16/2015   Osteoarthritis of spine with radiculopathy, cervical region 08/08/2015   Past Medical History:  Diagnosis Date   Arthritis      ROS See HPI.      Objective:    BP 119/69   Pulse 96   Ht 5\' 10"  (1.778 m)   Wt 230 lb (104.3 kg)   LMP 11/20/2017   SpO2  97%   BMI 33.00 kg/m  BP Readings from Last 3 Encounters:  08/12/21 119/69  05/24/21 115/79  02/15/19 112/79      Physical Exam Cardiovascular:     Rate and Rhythm: Normal rate.  Pulmonary:     Effort: Pulmonary effort is normal.  Chest:    Neurological:     Mental Status: She is alert.         Assessment & Plan:  11-21-1980Marland KitchenToluwanimi was seen today for breast mass.  Diagnoses and all orders for this visit:  Sebaceous cyst of skin of right breast  Need for shingles vaccine -     Varicella-zoster vaccine IM   Discussed sebaceous cyst with patient HO given If growing or becoming more red or painful may return No need for breast imaging at this time Use warm compresses and keep area dry from moisture.   Shawna Orleans, PA-C

## 2021-12-09 ENCOUNTER — Ambulatory Visit: Payer: BC Managed Care – PPO

## 2021-12-16 ENCOUNTER — Ambulatory Visit (INDEPENDENT_AMBULATORY_CARE_PROVIDER_SITE_OTHER): Payer: BC Managed Care – PPO | Admitting: Physician Assistant

## 2021-12-16 VITALS — Temp 97.9°F

## 2021-12-16 DIAGNOSIS — Z23 Encounter for immunization: Secondary | ICD-10-CM | POA: Diagnosis not present

## 2021-12-17 NOTE — Progress Notes (Signed)
Agree with above plan. 

## 2022-06-16 ENCOUNTER — Other Ambulatory Visit: Payer: Self-pay | Admitting: Physician Assistant

## 2022-06-16 DIAGNOSIS — Z1231 Encounter for screening mammogram for malignant neoplasm of breast: Secondary | ICD-10-CM

## 2022-06-25 ENCOUNTER — Ambulatory Visit (INDEPENDENT_AMBULATORY_CARE_PROVIDER_SITE_OTHER): Payer: BC Managed Care – PPO

## 2022-06-25 DIAGNOSIS — Z1231 Encounter for screening mammogram for malignant neoplasm of breast: Secondary | ICD-10-CM | POA: Diagnosis not present

## 2022-06-27 NOTE — Progress Notes (Signed)
Normal mammogram. Follow up in 1 year.

## 2022-10-21 ENCOUNTER — Ambulatory Visit: Payer: BC Managed Care – PPO | Admitting: Family Medicine

## 2022-10-21 ENCOUNTER — Encounter: Payer: Self-pay | Admitting: Family Medicine

## 2022-10-21 VITALS — BP 116/75 | HR 92 | Temp 98.9°F | Ht 70.0 in | Wt 241.0 lb

## 2022-10-21 DIAGNOSIS — Z20822 Contact with and (suspected) exposure to covid-19: Secondary | ICD-10-CM

## 2022-10-21 DIAGNOSIS — U071 COVID-19: Secondary | ICD-10-CM | POA: Insufficient documentation

## 2022-10-21 LAB — POCT INFLUENZA A/B
Influenza A, POC: NEGATIVE
Influenza B, POC: NEGATIVE

## 2022-10-21 LAB — POC COVID19 BINAXNOW: SARS Coronavirus 2 Ag: POSITIVE — AB

## 2022-10-21 MED ORDER — NIRMATRELVIR/RITONAVIR (PAXLOVID)TABLET: 3.0000 | ORAL_TABLET | Freq: Two times a day (BID) | ORAL | 0 refills | Status: DC

## 2022-10-21 MED ORDER — NIRMATRELVIR/RITONAVIR (PAXLOVID)TABLET: 3.0000 | ORAL_TABLET | Freq: Two times a day (BID) | ORAL | 0 refills | Status: AC

## 2022-10-21 NOTE — Patient Instructions (Signed)
Start paxlovid Stay well hydrated Vitamin C, Vitamin D and zinc supplements can be helpful for your immune system.  Tylenol/Ibuprofen as needed for fever/aches Delsym for cough.  Contact clinic if symptoms worsen significantly.

## 2022-10-21 NOTE — Assessment & Plan Note (Signed)
COVID positive in clinic.  Recommend supportive care including staying well hydrated.  She would like course of paxlovid, will add this as well. Red flags discussed including reasons to return to clinic and/or see emergency care.

## 2022-10-21 NOTE — Progress Notes (Signed)
Renee Hopkins - 57 y.o. female MRN [id number]  Date of birth: Feb 15, 1966  Subjective Chief Complaint  Patient presents with   Suspected COVID    HPI Renee Hopkins is a 57 y.o. female here today with complaint of nasal congestion, body aches, post nasal drainage, fatigue and headache.  Symptoms started last night.  Her husband tested positive for COVID over the weekend.  She thinks she may have had fever but isn't sure.  She has not taken anything for symptoms so far at home.   ROS:  A comprehensive ROS was completed and negative except as noted per HPI  Allergies  Allergen Reactions   Oxycodone-Acetaminophen Nausea And Vomiting    Past Medical History:  Diagnosis Date   Arthritis    back    Past Surgical History:  Procedure Laterality Date   CERVICAL DISC ARTHROPLASTY N/A 08/08/2015   Procedure: Cervical five-six Cervical six-seven  Anterior cervical Arthroplasty;  Surgeon: Coletta Memos, MD;  Location: [facility];  Service: Neurosurgery;  Laterality: N/A;   CYSTECTOMY Right    shoulder   SHOULDER SURGERY      Social History   Socioeconomic History   Marital status: Married    Spouse name: Not on file   Number of children: Not on file   Years of education: Not on file   Highest education level: Not on file  Occupational History   Not on file  Tobacco Use   Smoking status: Never   Smokeless tobacco: Never  Substance and Sexual Activity   Alcohol use: Not Currently    Comment: rarely   Drug use: No   Sexual activity: Yes    Partners: Male  Other Topics Concern   Not on file  Social History Narrative   Not on file   Social Determinants of Health   Financial Resource Strain: Not on file  Food Insecurity: Not on file  Transportation Needs: Not on file  Physical Activity: Not on file  Stress: Not on file  Social Connections: Unknown (07/06/2021)   Received from [facility]   Social Network    Social Network: Not on file    Family  History  Problem Relation Age of Onset   Diabetes Father    Hypertension Father    Hypertension Brother    Stroke Maternal Grandmother     Health Maintenance  Topic Date Due   HIV Screening  Never done   Hepatitis C Screening  Never done   COVID-19 Vaccine (3 - Pfizer risk series) 06/20/2019   DTaP/Tdap/Td (2 - Td or Tdap) 12/15/2021   Colonoscopy  06/13/2022   PAP SMEAR-Modifier  06/27/2022   INFLUENZA VACCINE  09/25/2022   MAMMOGRAM  06/25/2023   Zoster Vaccines- Shingrix  Completed   HPV VACCINES  Aged Out     ----------------------------------------------------------------------------------------------------------------------------------------------------------------------------------------------------------------- Physical Exam BP 116/75 (BP Location: Left Arm, Patient Position: Sitting, Cuff Size: Large)   Pulse 92   Temp 98.9 F (37.2 C) (Oral)   Ht 5\' 10"  (1.778 m)   Wt 241 lb (109.3 kg)   LMP 11/20/2017   SpO2 96%   BMI 34.58 kg/m   Physical Exam Constitutional:      Appearance: Normal appearance.  HENT:     Head: Normocephalic and atraumatic.  Eyes:     General: No scleral icterus. Cardiovascular:     Rate and Rhythm: Normal rate and regular rhythm.  Pulmonary:     Effort: Pulmonary effort is normal.     Breath  sounds: Normal breath sounds.  Neurological:     Mental Status: She is alert.  Psychiatric:        Mood and Affect: Mood normal.        Behavior: Behavior normal.     ------------------------------------------------------------------------------------------------------------------------------------------------------------------------------------------------------------------- Assessment and Plan  COVID COVID positive in clinic.  Recommend supportive care including staying well hydrated.  She would like course of paxlovid, will add this as well. Red flags discussed including reasons to return to clinic and/or see emergency care.     Meds  ordered this encounter  Medications   DISCONTD: nirmatrelvir/ritonavir (PAXLOVID) 20 x 150 MG & 10 x 100MG  TABS    Sig: Take 3 tablets by mouth 2 (two) times daily for 5 days. (Take nirmatrelvir 150 mg two tablets twice daily for 5 days and ritonavir 100 mg one tablet twice daily for 5 days) Patient GFR is 83    Dispense:  30 tablet    Refill:  0   nirmatrelvir/ritonavir (PAXLOVID) 20 x 150 MG & 10 x 100MG  TABS    Sig: Take 3 tablets by mouth 2 (two) times daily for 5 days. (Take nirmatrelvir 150 mg two tablets twice daily for 5 days and ritonavir 100 mg one tablet twice daily for 5 days) Patient GFR is 83    Dispense:  30 tablet    Refill:  0    No follow-ups on file.    This visit occurred during the SARS-CoV-2 public health emergency.  Safety protocols were in place, including screening questions prior to the visit, additional usage of staff PPE, and extensive cleaning of exam room while observing appropriate contact time as indicated for disinfecting solutions.

## 2023-05-25 ENCOUNTER — Other Ambulatory Visit: Payer: Self-pay | Admitting: Physician Assistant

## 2023-05-25 DIAGNOSIS — Z1231 Encounter for screening mammogram for malignant neoplasm of breast: Secondary | ICD-10-CM

## 2023-07-01 ENCOUNTER — Ambulatory Visit: Payer: Self-pay

## 2023-07-01 DIAGNOSIS — Z1231 Encounter for screening mammogram for malignant neoplasm of breast: Secondary | ICD-10-CM

## 2023-07-03 ENCOUNTER — Encounter: Payer: Self-pay | Admitting: Physician Assistant

## 2023-07-03 NOTE — Progress Notes (Signed)
 Normal mammogram. Follow up in 1 year.

## 2023-09-10 ENCOUNTER — Telehealth: Payer: Self-pay | Admitting: Physician Assistant

## 2023-09-10 NOTE — Telephone Encounter (Signed)
 Copied from CRM [id number]. 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 Patient was contacted but has not spoken to her yet.  Charlott Bud with [employer] is available for collaboration. Callback [phone]
